# Patient Record
Sex: Male | Born: 1995 | Race: White | Hispanic: No | Marital: Single | Smoking: Never smoker
Health system: Southern US, Community
[De-identification: ages and names within clinical notes are randomized; demographics above are authoritative.]

## PROBLEM LIST (undated history)

## (undated) DIAGNOSIS — F913 Oppositional defiant disorder: Secondary | ICD-10-CM

## (undated) DIAGNOSIS — F909 Attention-deficit hyperactivity disorder, unspecified type: Secondary | ICD-10-CM

## (undated) HISTORY — DX: Attention-deficit hyperactivity disorder, unspecified type: F90.9

## (undated) HISTORY — DX: Oppositional defiant disorder: F91.3

---
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
## 2017-02-21 ENCOUNTER — Telehealth (HOSPITAL_COMMUNITY): Payer: Self-pay | Admitting: *Deleted

## 2017-02-21 NOTE — Telephone Encounter (Signed)
Pt called stating his insurance will not pay for the 90 days supply for his Vyvanse. Per pt his insurance will only pay for the only 30 days at a time supply. Per pt, he would like to know if Dr. Tenny Crawoss could reprint 3 scripts for Vyvanse with each of them for 30 days supply. Pt number is [phone]. Per pt he will pick up the scripts the week of the Aug 12th when he gets back from vacation.

## 2017-02-22 ENCOUNTER — Other Ambulatory Visit (HOSPITAL_COMMUNITY): Payer: Self-pay | Admitting: Psychiatry

## 2017-02-22 MED ORDER — LISDEXAMFETAMINE DIMESYLATE 70 MG PO CAPS: 70.0000 mg | ORAL_CAPSULE | ORAL | 0 refills | Status: DC

## 2017-02-22 MED ORDER — LISDEXAMFETAMINE DIMESYLATE 70 MG PO CAPS: 70.0000 mg | ORAL_CAPSULE | Freq: Every day | ORAL | 0 refills | Status: DC

## 2017-02-22 NOTE — Telephone Encounter (Signed)
printed

## 2017-02-25 NOTE — Telephone Encounter (Signed)
lmtcb

## 2017-03-07 ENCOUNTER — Telehealth (HOSPITAL_COMMUNITY): Payer: Self-pay | Admitting: *Deleted

## 2017-03-07 ENCOUNTER — Other Ambulatory Visit (HOSPITAL_COMMUNITY): Payer: Self-pay | Admitting: Psychiatry

## 2017-03-07 DIAGNOSIS — F902 Attention-deficit hyperactivity disorder, combined type: Secondary | ICD-10-CM

## 2017-03-07 MED ORDER — LISDEXAMFETAMINE DIMESYLATE 70 MG PO CAPS: 70.0000 mg | ORAL_CAPSULE | ORAL | 0 refills | Status: DC

## 2017-03-07 MED ORDER — LISDEXAMFETAMINE DIMESYLATE 70 MG PO CAPS: 70.0000 mg | ORAL_CAPSULE | Freq: Every day | ORAL | 0 refills | Status: DC

## 2017-03-07 MED ORDER — GUANFACINE HCL ER 2 MG PO TB24: 2.0000 mg | ORAL_TABLET | Freq: Every day | ORAL | 1 refills | Status: DC

## 2017-03-07 NOTE — Telephone Encounter (Signed)
Pt came into office to pick up his printed script for his Vyvanse. Script Order ID number is [id number] and [id number] and ID number is [id number]. Pt D/L number is [id number] exp 03-18-2019. Pt showed understanding when both scripts were given to him.

## 2017-03-07 NOTE — Telephone Encounter (Signed)
Pt came into office and picked up printed scripts

## 2017-04-30 ENCOUNTER — Ambulatory Visit (HOSPITAL_COMMUNITY): Payer: Self-pay | Admitting: Psychiatry

## 2017-05-27 ENCOUNTER — Telehealth (HOSPITAL_COMMUNITY): Payer: Self-pay | Admitting: *Deleted

## 2017-05-27 NOTE — Telephone Encounter (Signed)
voice message from patient, said he need refill.   He is in college and can't come for appointment until school break.

## 2017-05-28 ENCOUNTER — Other Ambulatory Visit (HOSPITAL_COMMUNITY): Payer: Self-pay | Admitting: Psychiatry

## 2017-05-28 ENCOUNTER — Telehealth (HOSPITAL_COMMUNITY): Payer: Self-pay | Admitting: *Deleted

## 2017-05-28 MED ORDER — LISDEXAMFETAMINE DIMESYLATE 70 MG PO CAPS: 70.0000 mg | ORAL_CAPSULE | Freq: Every day | ORAL | 0 refills | Status: DC

## 2017-05-28 NOTE — Telephone Encounter (Signed)
Spoke with patient notified that RX available for pick up

## 2017-05-28 NOTE — Telephone Encounter (Signed)
Printed, he or family member will need to pick it up

## 2017-05-29 NOTE — Telephone Encounter (Signed)
noted 

## 2017-05-30 ENCOUNTER — Telehealth (HOSPITAL_COMMUNITY): Payer: Self-pay | Admitting: *Deleted

## 2017-05-30 NOTE — Telephone Encounter (Signed)
voice message from patient said he cant come until school break, he is in college.  He wants to speak with Lajoyce Cornersctavia about his prescription.

## 2017-05-31 ENCOUNTER — Telehealth (HOSPITAL_COMMUNITY): Payer: Self-pay | Admitting: *Deleted

## 2017-05-31 NOTE — Telephone Encounter (Signed)
I printed it on 11/6, he or family member can pick it up

## 2017-05-31 NOTE — Telephone Encounter (Signed)
Spoke with pt and per verbal from pt to allow his mother to pick up his printed script due to him currently being at school. Printed script is for Vyvanse 70 mg. Script ID number is [id number] and script order ID number is [id number]. Pt mother name is Darden AmberCaroline Blackstock and her D/L number is [id number] and expiration date is 09-24-2022.

## 2017-07-09 ENCOUNTER — Encounter (INDEPENDENT_AMBULATORY_CARE_PROVIDER_SITE_OTHER): Payer: Self-pay

## 2017-07-09 ENCOUNTER — Other Ambulatory Visit (HOSPITAL_COMMUNITY): Payer: Self-pay | Admitting: Psychiatry

## 2017-07-09 ENCOUNTER — Telehealth (HOSPITAL_COMMUNITY): Payer: Self-pay | Admitting: *Deleted

## 2017-07-09 ENCOUNTER — Ambulatory Visit (HOSPITAL_COMMUNITY): Payer: 59 | Admitting: Psychiatry

## 2017-07-09 ENCOUNTER — Encounter (HOSPITAL_COMMUNITY): Payer: Self-pay | Admitting: Psychiatry

## 2017-07-09 DIAGNOSIS — F902 Attention-deficit hyperactivity disorder, combined type: Secondary | ICD-10-CM

## 2017-07-09 DIAGNOSIS — Z818 Family history of other mental and behavioral disorders: Secondary | ICD-10-CM | POA: Diagnosis not present

## 2017-07-09 MED ORDER — GUANFACINE HCL ER 2 MG PO TB24: 2.0000 mg | ORAL_TABLET | Freq: Every day | ORAL | 0 refills | Status: DC

## 2017-07-09 MED ORDER — GUANFACINE HCL ER 2 MG PO TB24: 2.0000 mg | ORAL_TABLET | Freq: Every day | ORAL | 2 refills | Status: DC

## 2017-07-09 MED ORDER — LISDEXAMFETAMINE DIMESYLATE 70 MG PO CAPS
70.0000 mg | ORAL_CAPSULE | Freq: Every day | ORAL | 0 refills | Status: DC
Start: 2017-07-09 — End: 2017-12-04

## 2017-07-09 MED ORDER — LISDEXAMFETAMINE DIMESYLATE 70 MG PO CAPS: 70.0000 mg | ORAL_CAPSULE | Freq: Every day | ORAL | 0 refills | Status: DC

## 2017-07-09 NOTE — Telephone Encounter (Signed)
Patient notified

## 2017-07-09 NOTE — Progress Notes (Signed)
BH MD/PA/NP OP Progress Note  07/09/2017 2:46 PM Harry Mcdonald  MRN:  [id number]  Chief Complaint:  Chief Complaint    ADHD; Follow-up     HPI: He is a 21 year old white male who lives with his mother and stepfather primarily. He is a 10021 year old half brother in the home. His father also lives nearby and he spent some time with him as well. The [place] in [place] .He is a sophomore at [employer]  The patient was diagnosed with ADHD in the third grade and has been on medication ever since.  This year he is really getting some hard courses but so far he is doing well.   Staying well focused on the Vyvanse and guanfacine.  His mood has been good and he is sleeping and eating well.  He is participating in some intramural sports.  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  He denies any symptoms of depression Visit Diagnosis:    ICD-10-CM   1. ADHD (attention deficit hyperactivity disorder), combined type F90.2 lisdexamfetamine (VYVANSE) 70 MG capsule    guanFACINE (INTUNIV) 2 MG TB24 ER tablet    DISCONTINUED: lisdexamfetamine (VYVANSE) 70 MG capsule    DISCONTINUED: guanFACINE (INTUNIV) 2 MG TB24 ER tablet    Past Psychiatric History: None  Past Medical History:  Past Medical History:  Diagnosis Date  . ADHD (attention deficit hyperactivity disorder)   . Oppositional defiant disorder    History reviewed. No pertinent surgical history.  Family Psychiatric History: See below  Family History:  Family History  Problem Relation Age of Onset  . Depression Mother   . ADD / ADHD Mother   . ADD / ADHD Father   . Anxiety disorder Father   . Depression Father   . OCD Paternal Grandfather   . Depression Paternal Grandmother   . Seizures Paternal Grandmother        secondary to head trauma  . Physical abuse Paternal Grandmother   . Alcohol abuse Neg Hx   . Drug abuse Neg Hx   . Bipolar disorder Neg Hx   . Dementia Neg Hx   . Paranoid behavior Neg Hx   .  Schizophrenia Neg Hx   . Sexual abuse Neg Hx     Social History:  Social History   Socioeconomic History  . Marital status: Single    Spouse name: None  . Number of children: None  . Years of education: None  . Highest education level: None  Social Needs  . Financial resource strain: None  . Food insecurity - worry: None  . Food insecurity - inability: None  . Transportation needs - medical: None  . Transportation needs - non-medical: None  Occupational History  . None  Tobacco Use  . Smoking status: Never Smoker  . Smokeless tobacco: Never Used  Substance and Sexual Activity  . Alcohol use: No  . Drug use: No  . Sexual activity: None  Other Topics Concern  . None  Social History Narrative  . None    Allergies: No Known Allergies  Metabolic Disorder Labs: No results found for: HGBA1C, MPG No results found for: PROLACTIN No results found for: CHOL, TRIG, HDL, CHOLHDL, VLDL, LDLCALC No results found for: TSH  Therapeutic Level Labs: No results found for: LITHIUM No results found for: VALPROATE No components found for:  CBMZ  Current Medications: Current Outpatient Medications  Medication Sig Dispense Refill  . dexmethylphenidate (FOCALIN) 10 MG tablet Take one after school 30 tablet  0  . dexmethylphenidate (FOCALIN) 10 MG tablet Take one after school 30 tablet 0  . dexmethylphenidate (FOCALIN) 10 MG tablet Take one after school 60 tablet 0  . fexofenadine (ALLEGRA) 180 MG tablet Take 180 mg by mouth daily.    Marland Kitchen. guanFACINE (INTUNIV) 2 MG TB24 ER tablet Take 1 tablet (2 mg total) by mouth daily. 90 tablet 0  . lisdexamfetamine (VYVANSE) 70 MG capsule Take 1 capsule (70 mg total) by mouth every morning. 30 capsule 0  . lisdexamfetamine (VYVANSE) 70 MG capsule Take 1 capsule (70 mg total) by mouth daily. 30 capsule 0  . lisdexamfetamine (VYVANSE) 70 MG capsule Take 1 capsule (70 mg total) by mouth daily. 30 capsule 0   No current facility-administered medications  for this visit.      Musculoskeletal: Strength & Muscle Tone: within normal limits Gait & Station: normal Patient leans: N/A  Psychiatric Specialty Exam: Review of Systems  All other systems reviewed and are negative.   Blood pressure 112/62, pulse 87, height 5\' 7"  (1.702 m), weight 162 lb (73.5 kg), SpO2 96 %.Body mass index is 25.37 kg/m.  General Appearance: Casual and Fairly Groomed  Eye Contact:  Good  Speech:  Clear and Coherent  Volume:  Normal  Mood:  Euthymic  Affect:  Congruent  Thought Process:  Goal Directed  Orientation:  Full (Time, Place, and Person)  Thought Content: WDL   Suicidal Thoughts:  No  Homicidal Thoughts:  No  Memory:  Immediate;   Good Recent;   Good Remote;   Good  Judgement:  Good  Insight:  Good  Psychomotor Activity:  Normal  Concentration:  Concentration: Good and Attention Span: Good  Recall:  Good  Fund of Knowledge: Good  Language: Good  Akathisia:  No  Handed:  Right  AIMS (if indicated): not done  Assets:  Communication Skills Desire for Improvement Physical Health Resilience Social Support Talents/Skills  ADL's:  Intact  Cognition: WNL  Sleep:  Good   Screenings:   Assessment and Plan: 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  He is doing very well on the combination of Vyvanse 70 mg every morning and Intuniv 2 mg daily for ADHD.  He will continue these medications and return to see me in 6 months   Diannia Rudereborah Ross, MD 07/09/2017, 2:46 PM

## 2017-07-09 NOTE — Telephone Encounter (Signed)
Dr. Doyce Paraoss  [facility] called stating that his insurance will not cover 90 day supply. It must be 3 day supply for Intuniv &   That vyanse can't be refilled before 08/01/17 & that he has 10 left

## 2017-07-09 NOTE — Telephone Encounter (Signed)
Intuniv resent for 30 days, 2 refills to [facility]. The initial prescription for vyvanse was sent there as well and he can pick it up now

## 2017-10-24 ENCOUNTER — Other Ambulatory Visit (HOSPITAL_COMMUNITY): Payer: Self-pay | Admitting: Psychiatry

## 2017-10-24 ENCOUNTER — Telehealth (HOSPITAL_COMMUNITY): Payer: Self-pay | Admitting: *Deleted

## 2017-10-24 DIAGNOSIS — F902 Attention-deficit hyperactivity disorder, combined type: Secondary | ICD-10-CM

## 2017-10-24 MED ORDER — LISDEXAMFETAMINE DIMESYLATE 70 MG PO CAPS: 70.0000 mg | ORAL_CAPSULE | Freq: Every day | ORAL | 0 refills | Status: DC

## 2017-10-24 MED ORDER — GUANFACINE HCL ER 2 MG PO TB24: 2.0000 mg | ORAL_TABLET | Freq: Every day | ORAL | 2 refills | Status: DC

## 2017-10-24 MED ORDER — LISDEXAMFETAMINE DIMESYLATE 70 MG PO CAPS: 70.0000 mg | ORAL_CAPSULE | ORAL | 0 refills | Status: DC

## 2017-10-24 NOTE — Telephone Encounter (Signed)
sent 

## 2017-10-24 NOTE — Telephone Encounter (Signed)
Dr Tenny Crawoss Patient called stating he's in school & that he needs a refill sent to school Pharmacy  [facility] Stated that he needs the Vyvanse

## 2017-12-03 ENCOUNTER — Telehealth (HOSPITAL_COMMUNITY): Payer: Self-pay | Admitting: *Deleted

## 2017-12-03 NOTE — Telephone Encounter (Signed)
Dr Tenny Craw Patient called requesting refills . He is also scheduling a 6 month follow up

## 2017-12-04 ENCOUNTER — Other Ambulatory Visit (HOSPITAL_COMMUNITY): Payer: Self-pay | Admitting: Psychiatry

## 2017-12-04 DIAGNOSIS — F902 Attention-deficit hyperactivity disorder, combined type: Secondary | ICD-10-CM

## 2017-12-04 MED ORDER — LISDEXAMFETAMINE DIMESYLATE 70 MG PO CAPS: 70.0000 mg | ORAL_CAPSULE | Freq: Every day | ORAL | 0 refills | Status: DC

## 2017-12-04 MED ORDER — DEXMETHYLPHENIDATE HCL 10 MG PO TABS: ORAL_TABLET | ORAL | 0 refills | Status: DC

## 2017-12-04 MED ORDER — GUANFACINE HCL ER 2 MG PO TB24: 2.0000 mg | ORAL_TABLET | Freq: Every day | ORAL | 2 refills | Status: DC

## 2017-12-04 MED ORDER — LISDEXAMFETAMINE DIMESYLATE 70 MG PO CAPS: 70.0000 mg | ORAL_CAPSULE | ORAL | 0 refills | Status: DC

## 2017-12-04 NOTE — Telephone Encounter (Signed)
90 days sent in, but he needs appt

## 2017-12-09 ENCOUNTER — Encounter (HOSPITAL_COMMUNITY): Payer: Self-pay | Admitting: Psychiatry

## 2017-12-09 ENCOUNTER — Ambulatory Visit (INDEPENDENT_AMBULATORY_CARE_PROVIDER_SITE_OTHER): Payer: 59 | Admitting: Psychiatry

## 2017-12-09 VITALS — BP 132/82 | HR 80 | Ht 67.0 in | Wt 175.0 lb

## 2017-12-09 DIAGNOSIS — Z818 Family history of other mental and behavioral disorders: Secondary | ICD-10-CM | POA: Diagnosis not present

## 2017-12-09 DIAGNOSIS — F902 Attention-deficit hyperactivity disorder, combined type: Secondary | ICD-10-CM | POA: Diagnosis not present

## 2017-12-09 NOTE — Progress Notes (Signed)
BH MD/PA/NP OP Progress Note  12/09/2017 11:13 AM ACEL NATZKE  MRN:  [id number]  Chief Complaint:  Chief Complaint    ADHD; Follow-up     HPI: This patient is a 22 year old male who lives with his mother and stepfather primarily.  He has an 62 year old half brother in the home.  His father also lives nearby and he spends time with him as well.  The family lives in [place].  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  He has been on medication for this since the third grade.  He is focusing well in school but the courses are getting a lot harder.  On top of this he is not sure if he likes the engineering courses.  And is hard he thinks he really would rather be an Technical sales engineer but he is going to try to decide this fairly soon.  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  He denies any symptoms of depression and anxiety and still feels his medications of help with focus Visit Diagnosis:    ICD-10-CM   1. ADHD (attention deficit hyperactivity disorder), combined type F90.2     Past Psychiatric History: none  Past Medical History:  Past Medical History:  Diagnosis Date  . ADHD (attention deficit hyperactivity disorder)   . Oppositional defiant disorder    History reviewed. No pertinent surgical history.  Family Psychiatric History: See below  Family History:  Family History  Problem Relation Age of Onset  . Depression Mother   . ADD / ADHD Mother   . ADD / ADHD Father   . Anxiety disorder Father   . Depression Father   . OCD Paternal Grandfather   . Depression Paternal Grandmother   . Seizures Paternal Grandmother        secondary to head trauma  . Physical abuse Paternal Grandmother   . Alcohol abuse Neg Hx   . Drug abuse Neg Hx   . Bipolar disorder Neg Hx   . Dementia Neg Hx   . Paranoid behavior Neg Hx   .  Schizophrenia Neg Hx   . Sexual abuse Neg Hx     Social History:  Social History   Socioeconomic History  . Marital status: Single    Spouse name: Not on file  . Number of children: Not on file  . Years of education: Not on file  . Highest education level: Not on file  Occupational History  . Not on file  Social Needs  . Financial resource strain: Not on file  . Food insecurity:    Worry: Not on file    Inability: Not on file  . Transportation needs:    Medical: Not on file    Non-medical: Not on file  Tobacco Use  . Smoking status: Never Smoker  . Smokeless tobacco: Never Used  Substance and Sexual Activity  . Alcohol use: No  . Drug use: No  . Sexual activity: Not on file  Lifestyle  . Physical activity:    Days per week: Not on file    Minutes per session: Not on file  . Stress: Not on file  Relationships  . Social connections:    Talks on phone: Not on file    Gets together: Not on file    Attends religious service: Not on file    Active  member of club or organization: Not on file    Attends meetings of clubs or organizations: Not on file    Relationship status: Not on file  Other Topics Concern  . Not on file  Social History Narrative  . Not on file    Allergies: No Known Allergies  Metabolic Disorder Labs: No results found for: HGBA1C, MPG No results found for: PROLACTIN No results found for: CHOL, TRIG, HDL, CHOLHDL, VLDL, LDLCALC No results found for: TSH  Therapeutic Level Labs: No results found for: LITHIUM No results found for: VALPROATE No components found for:  CBMZ  Current Medications: Current Outpatient Medications  Medication Sig Dispense Refill  . dexmethylphenidate (FOCALIN) 10 MG tablet Take one after school 30 tablet 0  . dexmethylphenidate (FOCALIN) 10 MG tablet Take one after school 30 tablet 0  . dexmethylphenidate (FOCALIN) 10 MG tablet Take one after school 60 tablet 0  . fexofenadine (ALLEGRA) 180 MG tablet Take 180 mg by  mouth daily.    Marland Kitchen guanFACINE (INTUNIV) 2 MG TB24 ER tablet Take 1 tablet (2 mg total) by mouth daily. 30 tablet 2  . lisdexamfetamine (VYVANSE) 70 MG capsule Take 1 capsule (70 mg total) by mouth daily. 30 capsule 0  . lisdexamfetamine (VYVANSE) 70 MG capsule Take 1 capsule (70 mg total) by mouth every morning. 30 capsule 0  . lisdexamfetamine (VYVANSE) 70 MG capsule Take 1 capsule (70 mg total) by mouth daily. 30 capsule 0   No current facility-administered medications for this visit.      Musculoskeletal: Strength & Muscle Tone: within normal limits Gait & Station: normal Patient leans: N/A  Psychiatric Specialty Exam: Review of Systems  All other systems reviewed and are negative.   Blood pressure 132/82, pulse 80, height  (1.702 m), weight 175 lb (79.4 kg), SpO2 97 %.Body mass index is 27.41 kg/m.  General Appearance: Casual and Fairly Groomed  Eye Contact:  Good  Speech:  Clear and Coherent  Volume:  Normal  Mood:  Euthymic  Affect:  Congruent  Thought Process:  Goal Directed  Orientation:  Full (Time, Place, and Person)  Thought Content: Rumination   Suicidal Thoughts:  No  Homicidal Thoughts:  No  Memory:  Immediate;   Good Recent;   Good Remote;   Good  Judgement:  Good  Insight:  Good  Psychomotor Activity:  Normal  Concentration:  Concentration: Good and Attention Span: Good  Recall:  Good  Fund of Knowledge: Good  Language: Good  Akathisia:  No  Handed:  Right  AIMS (if indicated): not done  Assets:  Communication Skills Desire for Improvement Physical Health Resilience Social Support Vocational/Educational  ADL's:  Intact  Cognition: WNL  Sleep:  Good   Screenings:   Assessment and Plan: This patient is a 22 year old male with a history of ADHD.  He continues to do well on his current medications.  He will continue Vyvanse 70 mg every morning, Intuniv 2 mg daily and Focalin 10 mg in the afternoon for ADHD.  He will return to see me in 6  months   Diannia Ruder, MD 12/09/2017, 11:13 AM

## 2018-02-10 ENCOUNTER — Telehealth (HOSPITAL_COMMUNITY): Payer: Self-pay | Admitting: *Deleted

## 2018-02-10 NOTE — Telephone Encounter (Signed)
Patient called to see if he had any refills on his med's & to ask about how paperwork for school is handled

## 2018-02-19 ENCOUNTER — Encounter (HOSPITAL_COMMUNITY): Payer: Self-pay | Admitting: Psychiatry

## 2018-02-19 ENCOUNTER — Ambulatory Visit (INDEPENDENT_AMBULATORY_CARE_PROVIDER_SITE_OTHER): Payer: 59 | Admitting: Psychiatry

## 2018-02-19 DIAGNOSIS — F902 Attention-deficit hyperactivity disorder, combined type: Secondary | ICD-10-CM | POA: Diagnosis not present

## 2018-02-19 MED ORDER — DEXMETHYLPHENIDATE HCL 10 MG PO TABS: ORAL_TABLET | ORAL | 0 refills | Status: DC

## 2018-02-19 MED ORDER — LISDEXAMFETAMINE DIMESYLATE 70 MG PO CAPS: 70.0000 mg | ORAL_CAPSULE | ORAL | 0 refills | Status: DC

## 2018-02-19 MED ORDER — GUANFACINE HCL ER 2 MG PO TB24: 2.0000 mg | ORAL_TABLET | Freq: Every day | ORAL | 2 refills | Status: DC

## 2018-02-19 MED ORDER — LISDEXAMFETAMINE DIMESYLATE 70 MG PO CAPS: 70.0000 mg | ORAL_CAPSULE | Freq: Every day | ORAL | 0 refills | Status: DC

## 2018-02-19 NOTE — Progress Notes (Signed)
BH MD/PA/NP OP Progress Note  02/19/2018 3:18 PM Harry Mcdonald  MRN:  [id number]  Chief Complaint:  Chief Complaint    ADHD; Follow-up     HPI: This patient is a 22 year old male who lives between the homes of his divorced parents.  The summer he is staying with his father in [place].  His mother lives in [place].  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  The patient returns for follow-up regarding ADHD.  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  He states that his medications are working well for focus.  However he had me fill out some forms for disability services at [facility] so that he can get extended time on testing in a separate setting.  He states that he was having trouble with task completion in the allotted time.  He denies any other issues at school.  Any student there can get free tutoring and is going to pursue this. Visit Diagnosis:    ICD-10-CM   1. ADHD (attention deficit hyperactivity disorder), combined type F90.2 lisdexamfetamine (VYVANSE) 70 MG capsule    guanFACINE (INTUNIV) 2 MG TB24 ER tablet    Past Psychiatric History: none  Past Medical History:  Past Medical History:  Diagnosis Date  . ADHD (attention deficit hyperactivity disorder)   . Oppositional defiant disorder    History reviewed. No pertinent surgical history.  Family Psychiatric History: See below  Family History:  Family History  Problem Relation Age of Onset  . Depression Mother   . ADD / ADHD Mother   . ADD / ADHD Father   . Anxiety disorder Father   . Depression Father   . OCD Paternal Grandfather   . Depression Paternal Grandmother   . Seizures Paternal Grandmother        secondary to head trauma  . Physical abuse Paternal Grandmother   . Alcohol abuse Neg Hx   . Drug abuse Neg Hx   . Bipolar disorder Neg Hx   . Dementia Neg Hx   . Paranoid behavior Neg Hx   .  Schizophrenia Neg Hx   . Sexual abuse Neg Hx     Social History:  Social History   Socioeconomic History  . Marital status: Single    Spouse name: Not on file  . Number of children: Not on file  . Years of education: Not on file  . Highest education level: Not on file  Occupational History  . Not on file  Social Needs  . Financial resource strain: Not on file  . Food insecurity:    Worry: Not on file    Inability: Not on file  . Transportation needs:    Medical: Not on file    Non-medical: Not on file  Tobacco Use  . Smoking status: Never Smoker  . Smokeless tobacco: Never Used  Substance and Sexual Activity  . Alcohol use: No  . Drug use: No  . Sexual activity: Not on file  Lifestyle  . Physical activity:    Days per week: Not on file    Minutes per session: Not on file  . Stress: Not on file  Relationships  . Social connections:    Talks on phone: Not on file    Gets together: Not on file    Attends religious service: Not on file    Active member of club or organization: Not on file  Attends meetings of clubs or organizations: Not on file    Relationship status: Not on file  Other Topics Concern  . Not on file  Social History Narrative  . Not on file    Allergies: No Known Allergies  Metabolic Disorder Labs: No results found for: HGBA1C, MPG No results found for: PROLACTIN No results found for: CHOL, TRIG, HDL, CHOLHDL, VLDL, LDLCALC No results found for: TSH  Therapeutic Level Labs: No results found for: LITHIUM No results found for: VALPROATE No components found for:  CBMZ  Current Medications: Current Outpatient Medications  Medication Sig Dispense Refill  . dexmethylphenidate (FOCALIN) 10 MG tablet Take one after school 30 tablet 0  . dexmethylphenidate (FOCALIN) 10 MG tablet Take one after school 30 tablet 0  . dexmethylphenidate (FOCALIN) 10 MG tablet Take one after school 60 tablet 0  . fexofenadine (ALLEGRA) 180 MG tablet Take 180 mg by  mouth daily.    Marland Kitchen. guanFACINE (INTUNIV) 2 MG TB24 ER tablet Take 1 tablet (2 mg total) by mouth daily. 30 tablet 2  . lisdexamfetamine (VYVANSE) 70 MG capsule Take 1 capsule (70 mg total) by mouth daily. 30 capsule 0  . lisdexamfetamine (VYVANSE) 70 MG capsule Take 1 capsule (70 mg total) by mouth every morning. 30 capsule 0  . lisdexamfetamine (VYVANSE) 70 MG capsule Take 1 capsule (70 mg total) by mouth daily. 30 capsule 0   No current facility-administered medications for this visit.      Musculoskeletal: Strength & Muscle Tone: within normal limits Gait & Station: normal Patient leans: N/A  Psychiatric Specialty Exam: Review of Systems  All other systems reviewed and are negative.   Blood pressure 103/64, pulse 63, height 5\' 7"  (1.702 m), weight 176 lb (79.8 kg), SpO2 97 %.Body mass index is 27.57 kg/m.  General Appearance: Casual and Fairly Groomed  Eye Contact:  Good  Speech:  Clear and Coherent  Volume:  Normal  Mood:  Euthymic  Affect:  Congruent  Thought Process:  Goal Directed  Orientation:  Full (Time, Place, and Person)  Thought Content: WDL   Suicidal Thoughts:  No  Homicidal Thoughts:  No  Memory:  Immediate;   Good Recent;   Good Remote;   Good  Judgement:  Good  Insight:  Good  Psychomotor Activity:  Normal  Concentration:  Concentration: Good and Attention Span: Good  Recall:  Good  Fund of Knowledge: Good  Language: Good  Akathisia:  No  Handed:  Right  AIMS (if indicated): not done  Assets:  Communication Skills Desire for Improvement Physical Health Resilience Social Support Talents/Skills Vocational/Educational  ADL's:  Intact  Cognition: WNL  Sleep:  Good   Screenings:   Assessment and Plan: Patient is a 22 year old male with a history of ADHD.  He is doing well on his current regimen.  He will continue Vyvanse 70 mg every morning and Focalin 10 mg in the afternoon for ADHD symptoms as well as Intuniv 2 mg every morning for ADHD as well.   He will return to see me in 4 months   Harry Rudereborah Tabari Volkert, MD 02/19/2018, 3:18 PM

## 2018-03-10 ENCOUNTER — Telehealth (HOSPITAL_COMMUNITY): Payer: Self-pay

## 2018-03-10 NOTE — Telephone Encounter (Signed)
Patient wants V[facility]se [facility].

## 2018-03-11 ENCOUNTER — Other Ambulatory Visit (HOSPITAL_COMMUNITY): Payer: Self-pay | Admitting: Psychiatry

## 2018-03-11 DIAGNOSIS — F902 Attention-deficit hyperactivity disorder, combined type: Secondary | ICD-10-CM

## 2018-03-11 MED ORDER — LISDEXAMFETAMINE DIMESYLATE 70 MG PO CAPS: 70.0000 mg | ORAL_CAPSULE | ORAL | 0 refills | Status: DC

## 2018-03-11 MED ORDER — LISDEXAMFETAMINE DIMESYLATE 70 MG PO CAPS: 70.0000 mg | ORAL_CAPSULE | Freq: Every day | ORAL | 0 refills | Status: DC

## 2018-03-11 NOTE — Telephone Encounter (Signed)
3 months sent

## 2018-03-11 NOTE — Telephone Encounter (Signed)
Called patient he picked up medication today

## 2018-04-14 ENCOUNTER — Other Ambulatory Visit (HOSPITAL_COMMUNITY): Payer: Self-pay | Admitting: Psychiatry

## 2018-04-14 ENCOUNTER — Telehealth (HOSPITAL_COMMUNITY): Payer: Self-pay | Admitting: *Deleted

## 2018-04-14 DIAGNOSIS — F902 Attention-deficit hyperactivity disorder, combined type: Secondary | ICD-10-CM

## 2018-04-14 MED ORDER — LISDEXAMFETAMINE DIMESYLATE 70 MG PO CAPS: 70.0000 mg | ORAL_CAPSULE | Freq: Every day | ORAL | 0 refills | Status: DC

## 2018-04-14 MED ORDER — DEXMETHYLPHENIDATE HCL 10 MG PO TABS: ORAL_TABLET | ORAL | 0 refills | Status: DC

## 2018-04-14 MED ORDER — LISDEXAMFETAMINE DIMESYLATE 70 MG PO CAPS: 70.0000 mg | ORAL_CAPSULE | ORAL | 0 refills | Status: DC

## 2018-04-14 NOTE — Telephone Encounter (Signed)
Dr Tenny Crawoss Patient called he's back in school last visit was 02/19/18 & he's requesting refills on Focalin &  Vyvanse to be sent to Rx [place]

## 2018-04-14 NOTE — Telephone Encounter (Signed)
done

## 2018-04-14 NOTE — Telephone Encounter (Signed)
Opened chart to check last visit

## 2018-05-15 ENCOUNTER — Other Ambulatory Visit (HOSPITAL_COMMUNITY): Payer: Self-pay | Admitting: Psychiatry

## 2018-05-15 ENCOUNTER — Telehealth (HOSPITAL_COMMUNITY): Payer: Self-pay | Admitting: *Deleted

## 2018-05-15 DIAGNOSIS — F902 Attention-deficit hyperactivity disorder, combined type: Secondary | ICD-10-CM

## 2018-05-15 MED ORDER — GUANFACINE HCL ER 2 MG PO TB24: 2.0000 mg | ORAL_TABLET | Freq: Every day | ORAL | 2 refills | Status: DC

## 2018-05-15 NOTE — Telephone Encounter (Signed)
Dr Tenny Craw Patient called to get refill on the  guanFACINE (INTUNIV) 2 MG TB24 ER tablet 30 tablet

## 2018-05-15 NOTE — Telephone Encounter (Signed)
sent 

## 2018-07-21 ENCOUNTER — Telehealth (HOSPITAL_COMMUNITY): Payer: Self-pay | Admitting: *Deleted

## 2018-07-21 ENCOUNTER — Other Ambulatory Visit (HOSPITAL_COMMUNITY): Payer: Self-pay | Admitting: Psychiatry

## 2018-07-21 DIAGNOSIS — F902 Attention-deficit hyperactivity disorder, combined type: Secondary | ICD-10-CM

## 2018-07-21 NOTE — Telephone Encounter (Signed)
Which pharmacy?

## 2018-07-21 NOTE — Telephone Encounter (Signed)
Dr Tenny Crawoss Patient home for winter break called requesting refills

## 2018-07-21 NOTE — Telephone Encounter (Signed)
Dr Dani Gobbleoss [facility] [place], Updated in system

## 2018-07-22 ENCOUNTER — Other Ambulatory Visit (HOSPITAL_COMMUNITY): Payer: Self-pay | Admitting: Psychiatry

## 2018-07-22 DIAGNOSIS — F902 Attention-deficit hyperactivity disorder, combined type: Secondary | ICD-10-CM

## 2018-07-22 MED ORDER — DEXMETHYLPHENIDATE HCL 10 MG PO TABS: ORAL_TABLET | ORAL | 0 refills | Status: DC

## 2018-07-22 MED ORDER — GUANFACINE HCL ER 2 MG PO TB24: 2.0000 mg | ORAL_TABLET | Freq: Every day | ORAL | 2 refills | Status: DC

## 2018-07-22 MED ORDER — LISDEXAMFETAMINE DIMESYLATE 70 MG PO CAPS: 70.0000 mg | ORAL_CAPSULE | Freq: Every day | ORAL | 0 refills | Status: DC

## 2018-07-22 NOTE — Telephone Encounter (Signed)
sent 

## 2018-09-01 ENCOUNTER — Other Ambulatory Visit (HOSPITAL_COMMUNITY): Payer: Self-pay | Admitting: Psychiatry

## 2018-10-08 ENCOUNTER — Other Ambulatory Visit (HOSPITAL_COMMUNITY): Payer: Self-pay | Admitting: Psychiatry

## 2018-10-08 ENCOUNTER — Telehealth (HOSPITAL_COMMUNITY): Payer: Self-pay | Admitting: *Deleted

## 2018-10-08 DIAGNOSIS — F902 Attention-deficit hyperactivity disorder, combined type: Secondary | ICD-10-CM

## 2018-10-08 MED ORDER — DEXMETHYLPHENIDATE HCL 10 MG PO TABS: ORAL_TABLET | ORAL | 0 refills | Status: DC

## 2018-10-08 MED ORDER — GUANFACINE HCL ER 2 MG PO TB24: 2.0000 mg | ORAL_TABLET | Freq: Every day | ORAL | 2 refills | Status: DC

## 2018-10-08 MED ORDER — LISDEXAMFETAMINE DIMESYLATE 70 MG PO CAPS: 70.0000 mg | ORAL_CAPSULE | ORAL | 0 refills | Status: DC

## 2018-10-08 NOTE — Telephone Encounter (Signed)
90 days sent on guanfacine, vyvanse and focalin are controlled so only can send 30 days

## 2018-10-08 NOTE — Telephone Encounter (Signed)
Dr Tenny Craw Patient called Mom has new insurance & preferred Rx has been updated.  Patient requested 90-day supply refill on psych med's

## 2018-10-09 MED FILL — DEXMETHYLPHENIDATE 10 MG TA: 10 | 30 days supply | Qty: 30 | Fill #0

## 2018-10-09 MED FILL — guanFACINE HCL ER 2 MG TB24: 2 | 90 days supply | Qty: 90 | Fill #0

## 2018-10-09 MED FILL — VYVANSE 70 MG CAPSULE: 70 | 30 days supply | Qty: 30 | Fill #0

## 2018-12-09 ENCOUNTER — Ambulatory Visit (HOSPITAL_COMMUNITY): Payer: Self-pay | Admitting: Psychiatry

## 2018-12-09 ENCOUNTER — Other Ambulatory Visit: Payer: Self-pay

## 2018-12-19 ENCOUNTER — Other Ambulatory Visit: Payer: Self-pay

## 2018-12-19 ENCOUNTER — Encounter (HOSPITAL_COMMUNITY): Payer: Self-pay | Admitting: Psychiatry

## 2018-12-19 ENCOUNTER — Ambulatory Visit (INDEPENDENT_AMBULATORY_CARE_PROVIDER_SITE_OTHER): Payer: No Typology Code available for payment source | Admitting: Psychiatry

## 2018-12-19 DIAGNOSIS — F902 Attention-deficit hyperactivity disorder, combined type: Secondary | ICD-10-CM

## 2018-12-19 MED ORDER — DEXMETHYLPHENIDATE HCL 10 MG PO TABS: ORAL_TABLET | ORAL | 0 refills | Status: DC

## 2018-12-19 MED ORDER — GUANFACINE HCL ER 2 MG PO TB24: 2.0000 mg | ORAL_TABLET | Freq: Every day | ORAL | 2 refills | Status: DC

## 2018-12-19 MED ORDER — LISDEXAMFETAMINE DIMESYLATE 70 MG PO CAPS: 70.0000 mg | ORAL_CAPSULE | ORAL | 0 refills | Status: DC

## 2018-12-19 MED ORDER — LISDEXAMFETAMINE DIMESYLATE 70 MG PO CAPS: 70.0000 mg | ORAL_CAPSULE | Freq: Every day | ORAL | 0 refills | Status: DC

## 2018-12-19 MED FILL — guanFACINE HCL ER 2 MG TB24: 2 | 90 days supply | Qty: 90 | Fill #0

## 2018-12-19 MED FILL — VYVANSE 70 MG CAPSULE: 70 | 30 days supply | Qty: 30 | Fill #0

## 2018-12-19 MED FILL — DEXMETHYLPHENIDATE 10 MG TA: 10 | 30 days supply | Qty: 30 | Fill #0

## 2018-12-19 NOTE — Progress Notes (Signed)
Virtual Visit via Telephone Note  I connected with Harry Mcdonald on 12/19/18 at  9:00 AM EDT by telephone and verified that I am speaking with the correct person using two identifiers.   I discussed the limitations, risks, security and privacy concerns of performing an evaluation and management service by telephone and the availability of in person appointments. I also discussed with the patient that there may be a patient responsible charge related to this service. The patient expressed understanding and agreed to proceed.      I discussed the assessment and treatment plan with the patient. The patient was provided an opportunity to ask questions and all were answered. The patient agreed with the plan and demonstrated an understanding of the instructions.   The patient was advised to call back or seek an in-person evaluation if the symptoms worsen or if the condition fails to improve as anticipated.  I provided 15 minutes of non-face-to-face time during this encounter.   Diannia Ruder, MD  [facility] MD/PA/NP OP Progress Note  12/19/2018 9:22 AM Harry Mcdonald  MRN:  [id number]  Chief Complaint:  Chief Complaint    ADHD; Follow-up     HPI: This patient is a 23 year old male who lives between the homes of his divorced parents.  This summer he is staying with his father in [place].  His mother lives in [place].  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  The patient returns for follow-up regarding ADHD.  He is assessed on the phone due to the coronavirus pandemic.  He states that he failed 1 class this past semester and will have to retake it.  Other than that he had done fairly well.  He stated that was hard to finish up his classes online and sometimes did not understand things.  However he still excited about his major and wants to keep going with it.  He does feel that the Vyvanse and Focalin as well as Intuniv have helped him stay focused. Visit  Diagnosis:    ICD-10-CM   1. ADHD (attention deficit hyperactivity disorder), combined type F90.2 guanFACINE (INTUNIV) 2 MG TB24 ER tablet    lisdexamfetamine (VYVANSE) 70 MG capsule    Past Psychiatric History: none  Past Medical History:  Past Medical History:  Diagnosis Date  . ADHD (attention deficit hyperactivity disorder)   . Oppositional defiant disorder    History reviewed. No pertinent surgical history.  Family Psychiatric History: see below  Family History:  Family History  Problem Relation Age of Onset  . Depression Mother   . ADD / ADHD Mother   . ADD / ADHD Father   . Anxiety disorder Father   . Depression Father   . OCD Paternal Grandfather   . Depression Paternal Grandmother   . Seizures Paternal Grandmother        secondary to head trauma  . Physical abuse Paternal Grandmother   . Alcohol abuse Neg Hx   . Drug abuse Neg Hx   . Bipolar disorder Neg Hx   . Dementia Neg Hx   . Paranoid behavior Neg Hx   . Schizophrenia Neg Hx   . Sexual abuse Neg Hx     Social History:  Social History   Socioeconomic History  . Marital status: Single    Spouse name: Not on file  . Number of children: Not on file  . Years of education: Not on file  . Highest education level: Not on file  Occupational History  . Not on file  Social Needs  . Financial resource strain: Not on file  . Food insecurity:    Worry: Not on file    Inability: Not on file  . Transportation needs:    Medical: Not on file    Non-medical: Not on file  Tobacco Use  . Smoking status: Never Smoker  . Smokeless tobacco: Never Used  Substance and Sexual Activity  . Alcohol use: No  . Drug use: No  . Sexual activity: Not on file  Lifestyle  . Physical activity:    Days per week: Not on file    Minutes per session: Not on file  . Stress: Not on file  Relationships  . Social connections:    Talks on phone: Not on file    Gets together: Not on file    Attends religious service: Not on  file    Active member of club or organization: Not on file    Attends meetings of clubs or organizations: Not on file    Relationship status: Not on file  Other Topics Concern  . Not on file  Social History Narrative  . Not on file    Allergies: No Known Allergies  Metabolic Disorder Labs: No results found for: HGBA1C, MPG No results found for: PROLACTIN No results found for: CHOL, TRIG, HDL, CHOLHDL, VLDL, LDLCALC No results found for: TSH  Therapeutic Level Labs: No results found for: LITHIUM No results found for: VALPROATE No components found for:  CBMZ  Current Medications: Current Outpatient Medications  Medication Sig Dispense Refill  . dexmethylphenidate (FOCALIN) 10 MG tablet Take one after school 30 tablet 0  . dexmethylphenidate (FOCALIN) 10 MG tablet Take one after school 60 tablet 0  . dexmethylphenidate (FOCALIN) 10 MG tablet Take one after school 30 tablet 0  . dexmethylphenidate (FOCALIN) 10 MG tablet TAKE ONE TABLET BY MOUTH AFTER SCHOOL 30 tablet 0  . fexofenadine (ALLEGRA) 180 MG tablet Take 180 mg by mouth daily.    Marland Kitchen. guanFACINE (INTUNIV) 2 MG TB24 ER tablet Take 1 tablet (2 mg total) by mouth daily. 90 tablet 2  . lisdexamfetamine (VYVANSE) 70 MG capsule Take 1 capsule (70 mg total) by mouth every morning. 30 capsule 0  . lisdexamfetamine (VYVANSE) 70 MG capsule Take 1 capsule (70 mg total) by mouth daily. 30 capsule 0  . lisdexamfetamine (VYVANSE) 70 MG capsule Take 1 capsule (70 mg total) by mouth daily. 30 capsule 0   No current facility-administered medications for this visit.      Musculoskeletal: Strength & Muscle Tone: within normal limits Gait & Station: normal Patient leans: N/A  Psychiatric Specialty Exam: Review of Systems  All other systems reviewed and are negative.   There were no vitals taken for this visit.There is no height or weight on file to calculate BMI.  General Appearance: NA  Eye Contact:  NA  Speech:  Clear and Coherent   Volume:  Normal  Mood:  Euthymic  Affect:  Appropriate and Congruent  Thought Process:  Goal Directed  Orientation:  Full (Time, Place, and Person)  Thought Content: WDL   Suicidal Thoughts:  No  Homicidal Thoughts:  No  Memory:  Immediate;   Good Recent;   Good Remote;   Fair  Judgement:  Good  Insight:  Fair  Psychomotor Activity:  Normal  Concentration:  Concentration: Good and Attention Span: Good  Recall:  Good  Fund of Knowledge: Good  Language: Good  Akathisia:  No  Handed:  Right  AIMS (if indicated): not done  Assets:  Communication Skills Desire for Improvement Physical Health Resilience Social Support Talents/Skills  ADL's:  Intact  Cognition: WNL  Sleep:  Good   Screenings:    Assessment and Plan: This patient is a 23 year old male with a history of ADHD.  He is doing well on his current regimen.  He will continue 70 mg every morning for focus as well as Focalin 10 mg as needed later in the day also for focus and Intuniv 2 mg to help augment the other medications for ADHD.  He will return to see me in 6 months   Diannia Ruder, MD 12/19/2018, 9:22 AM

## 2019-01-20 MED FILL — DEXMETHYLPHENIDATE 10 MG TA: 10 | 30 days supply | Qty: 30 | Fill #0

## 2019-01-20 MED FILL — VYVANSE 70 MG CAPSULE: 70 | 30 days supply | Qty: 30 | Fill #0

## 2019-03-03 MED FILL — VYVANSE 70 MG CAPSULE: 70 | 30 days supply | Qty: 30 | Fill #0

## 2019-03-03 MED FILL — DEXMETHYLPHENIDATE 10 MG TA: 10 | 60 days supply | Qty: 60 | Fill #0

## 2019-03-25 MED FILL — guanFACINE HCL ER 2 MG TB24: 2 | 90 days supply | Qty: 90 | Fill #1

## 2019-04-02 ENCOUNTER — Other Ambulatory Visit (HOSPITAL_COMMUNITY): Payer: Self-pay | Admitting: Psychiatry

## 2019-04-02 MED FILL — VYVANSE 70 MG CAPSULE: 70 | 30 days supply | Qty: 30 | Fill #0

## 2019-04-03 MED FILL — DENTA 5000 PLUS CREAM: 1.1 | 30 days supply | Qty: 102 | Fill #0

## 2019-05-11 ENCOUNTER — Other Ambulatory Visit (HOSPITAL_COMMUNITY): Payer: Self-pay | Admitting: Psychiatry

## 2019-05-11 MED FILL — VYVANSE 70 MG CAPSULE: 70 | 30 days supply | Qty: 30 | Fill #0

## 2019-05-11 MED FILL — DEXMETHYLPHENIDATE 10 MG TA: 10 | 60 days supply | Qty: 60 | Fill #0

## 2019-06-22 ENCOUNTER — Other Ambulatory Visit: Payer: Self-pay

## 2019-06-22 ENCOUNTER — Encounter (HOSPITAL_COMMUNITY): Payer: Self-pay | Admitting: Psychiatry

## 2019-06-22 ENCOUNTER — Ambulatory Visit (INDEPENDENT_AMBULATORY_CARE_PROVIDER_SITE_OTHER): Payer: No Typology Code available for payment source | Admitting: Psychiatry

## 2019-06-22 DIAGNOSIS — F902 Attention-deficit hyperactivity disorder, combined type: Secondary | ICD-10-CM

## 2019-06-22 MED ORDER — DEXMETHYLPHENIDATE HCL 10 MG PO TABS: ORAL_TABLET | ORAL | 0 refills | Status: DC

## 2019-06-22 MED ORDER — GUANFACINE HCL ER 2 MG PO TB24: 2.0000 mg | ORAL_TABLET | Freq: Every day | ORAL | 2 refills | Status: DC

## 2019-06-22 MED ORDER — LISDEXAMFETAMINE DIMESYLATE 70 MG PO CAPS: 70.0000 mg | ORAL_CAPSULE | ORAL | 0 refills | Status: DC

## 2019-06-22 MED ORDER — LISDEXAMFETAMINE DIMESYLATE 70 MG PO CAPS: 70.0000 mg | ORAL_CAPSULE | Freq: Every day | ORAL | 0 refills | Status: DC

## 2019-06-22 NOTE — Progress Notes (Signed)
Virtual Visit via Telephone Note  I connected with Harry Mcdonald on 06/22/19 at  1:00 PM EST by telephone and verified that I am speaking with the correct person using two identifiers.   I discussed the limitations, risks, security and privacy concerns of performing an evaluation and management service by telephone and the availability of in person appointments. I also discussed with the patient that there may be a patient responsible charge related to this service. The patient expressed understanding and agreed to proceed.     I discussed the assessment and treatment plan with the patient. The patient was provided an opportunity to ask questions and all were answered. The patient agreed with the plan and demonstrated an understanding of the instructions.   The patient was advised to call back or seek an in-person evaluation if the symptoms worsen or if the condition fails to improve as anticipated.  I provided 15 minutes of non-face-to-face time during this encounter.   Diannia Ruder, MD  [facility] MD/PA/NP OP Progress Note  06/22/2019 1:26 PM Harry Mcdonald  MRN:  [id number]  Chief Complaint:  Chief Complaint    ADHD; Follow-up     HPI: This patient is a 23 year old male who lives between the homes of his divorced parents.  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  The patient returns for follow-up regarding ADHD.  He states he is doing fairly well although it has been very challenging try to do all all of his classes online.  He probably has about a year left to finish his degree.  He states that when pandemic started he missed some days of medication and he started to feel depressed and unfocused so he is gone back on it.  He does feel that the Vyvanse and Focalin as well as Intuniv helps him stay focused.  He denies being depressed. Visit Diagnosis:    ICD-10-CM   1. ADHD (attention deficit hyperactivity disorder), combined type  F90.2 lisdexamfetamine  (VYVANSE) 70 MG capsule    guanFACINE (INTUNIV) 2 MG TB24 ER tablet    Past Psychiatric History: none  Past Medical History:  Past Medical History:  Diagnosis Date  . ADHD (attention deficit hyperactivity disorder)   . Oppositional defiant disorder    History reviewed. No pertinent surgical history.  Family Psychiatric History: see below  Family History:  Family History  Problem Relation Age of Onset  . Depression Mother   . ADD / ADHD Mother   . ADD / ADHD Father   . Anxiety disorder Father   . Depression Father   . OCD Paternal Grandfather   . Depression Paternal Grandmother   . Seizures Paternal Grandmother        secondary to head trauma  . Physical abuse Paternal Grandmother   . Alcohol abuse Neg Hx   . Drug abuse Neg Hx   . Bipolar disorder Neg Hx   . Dementia Neg Hx   . Paranoid behavior Neg Hx   . Schizophrenia Neg Hx   . Sexual abuse Neg Hx     Social History:  Social History   Socioeconomic History  . Marital status: Single    Spouse name: Not on file  . Number of children: Not on file  . Years of education: Not on file  . Highest education level: Not on file  Occupational History  . Not on file  Social Needs  . Financial resource strain: Not on file  . Food insecurity  Worry: Not on file    Inability: Not on file  . Transportation needs    Medical: Not on file    Non-medical: Not on file  Tobacco Use  . Smoking status: Never Smoker  . Smokeless tobacco: Never Used  Substance and Sexual Activity  . Alcohol use: No  . Drug use: No  . Sexual activity: Not on file  Lifestyle  . Physical activity    Days per week: Not on file    Minutes per session: Not on file  . Stress: Not on file  Relationships  . Social Herbalist on phone: Not on file    Gets together: Not on file    Attends religious service: Not on file    Active member of club or organization: Not on file    Attends meetings of clubs or organizations: Not on file     Relationship status: Not on file  Other Topics Concern  . Not on file  Social History Narrative  . Not on file    Allergies: No Known Allergies  Metabolic Disorder Labs: No results found for: HGBA1C, MPG No results found for: PROLACTIN No results found for: CHOL, TRIG, HDL, CHOLHDL, VLDL, LDLCALC No results found for: TSH  Therapeutic Level Labs: No results found for: LITHIUM No results found for: VALPROATE No components found for:  CBMZ  Current Medications: Current Outpatient Medications  Medication Sig Dispense Refill  . dexmethylphenidate (FOCALIN) 10 MG tablet Take one after school 30 tablet 0  . dexmethylphenidate (FOCALIN) 10 MG tablet Take one after school 30 tablet 0  . dexmethylphenidate (FOCALIN) 10 MG tablet TAKE ONE TABLET BY MOUTH AFTER SCHOOL 30 tablet 0  . fexofenadine (ALLEGRA) 180 MG tablet Take 180 mg by mouth daily.    Marland Kitchen guanFACINE (INTUNIV) 2 MG TB24 ER tablet Take 1 tablet (2 mg total) by mouth daily. 90 tablet 2  . lisdexamfetamine (VYVANSE) 70 MG capsule Take 1 capsule (70 mg total) by mouth daily. 30 capsule 0  . lisdexamfetamine (VYVANSE) 70 MG capsule Take 1 capsule (70 mg total) by mouth every morning. 30 capsule 0  . lisdexamfetamine (VYVANSE) 70 MG capsule Take 1 capsule (70 mg total) by mouth daily. 30 capsule 0   No current facility-administered medications for this visit.      Musculoskeletal: Strength & Muscle Tone: within normal limits Gait & Station: normal Patient leans: N/A  Psychiatric Specialty Exam: Review of Systems  All other systems reviewed and are negative.   There were no vitals taken for this visit.There is no height or weight on file to calculate BMI.  General Appearance: NA  Eye Contact:  NA  Speech:  Clear and Coherent  Volume:  Normal  Mood:  Euthymic  Affect:  NA  Thought Process:  Goal Directed  Orientation:  Full (Time, Place, and Person)  Thought Content: WDL   Suicidal Thoughts:  No  Homicidal Thoughts:   No  Memory:  Immediate;   Good Recent;   Good Remote;   Good  Judgement:  Good  Insight:  Fair  Psychomotor Activity:  Normal  Concentration:  Concentration: Good and Attention Span: Good  Recall:  Good  Fund of Knowledge: Good  Language: Good  Akathisia:  No  Handed:  Right  AIMS (if indicated): not done  Assets:  Communication Skills Desire for Improvement Physical Health Resilience Social Support Talents/Skills Vocational/Educational  ADL's:  Intact  Cognition: WNL  Sleep:  Good   Screenings:  Assessment and Plan: This patient is a 23 year old male with a history of ADHD.  He is doing well on his current regimen.  He will continue Vyvanse 70 mg every morning as well as Focalin 10 mg in the afternoon for focus and Intuniv 2 mg daily also for ADHD.  He will return to see me in 6 months   Diannia Rudereborah Lesa Vandall, MD 06/22/2019, 1:26 PM

## 2019-06-23 ENCOUNTER — Other Ambulatory Visit (HOSPITAL_COMMUNITY): Payer: Self-pay | Admitting: Psychiatry

## 2019-06-23 ENCOUNTER — Telehealth (HOSPITAL_COMMUNITY): Payer: Self-pay | Admitting: *Deleted

## 2019-06-23 DIAGNOSIS — F902 Attention-deficit hyperactivity disorder, combined type: Secondary | ICD-10-CM

## 2019-06-23 MED ORDER — LISDEXAMFETAMINE DIMESYLATE 70 MG PO CAPS: 70.0000 mg | ORAL_CAPSULE | Freq: Every day | ORAL | 0 refills | Status: DC

## 2019-06-23 MED ORDER — DEXMETHYLPHENIDATE HCL 10 MG PO TABS: ORAL_TABLET | ORAL | 0 refills | Status: DC

## 2019-06-23 MED ORDER — LISDEXAMFETAMINE DIMESYLATE 70 MG PO CAPS: 70.0000 mg | ORAL_CAPSULE | ORAL | 0 refills | Status: DC

## 2019-06-23 MED ORDER — GUANFACINE HCL ER 2 MG PO TB24: 2.0000 mg | ORAL_TABLET | Freq: Every day | ORAL | 2 refills | Status: DC

## 2019-06-23 MED FILL — guanFACINE HCL ER 2 MG TB24: 2 | 90 days supply | Qty: 90 | Fill #0

## 2019-06-23 MED FILL — VYVANSE 70 MG CAPSULE: 70 | 30 days supply | Qty: 30 | Fill #0

## 2019-06-23 NOTE — Telephone Encounter (Signed)
sent 

## 2019-06-23 NOTE — Telephone Encounter (Signed)
PATIENT CALLED & STATED HE FAILED TO MENTION MOM PREFERS MED REFILLS GO TO [facility]. UPDATED IN EPIC CURRENT &  REMOVED [facility] Rx

## 2019-08-03 MED FILL — VYVANSE 70 MG CAPSULE: 70 | 30 days supply | Qty: 30 | Fill #0

## 2019-09-03 MED FILL — VYVANSE 70 MG CAPSULE: 70 | 30 days supply | Qty: 30 | Fill #0

## 2019-10-08 ENCOUNTER — Other Ambulatory Visit (HOSPITAL_COMMUNITY): Payer: Self-pay | Admitting: Psychiatry

## 2019-10-08 DIAGNOSIS — F902 Attention-deficit hyperactivity disorder, combined type: Secondary | ICD-10-CM

## 2019-10-08 MED ORDER — LISDEXAMFETAMINE DIMESYLATE 70 MG PO CAPS: 70.0000 mg | ORAL_CAPSULE | Freq: Every day | ORAL | 0 refills | Status: DC

## 2019-10-08 MED ORDER — LISDEXAMFETAMINE DIMESYLATE 70 MG PO CAPS: 70.0000 mg | ORAL_CAPSULE | ORAL | 0 refills | Status: DC

## 2019-10-08 MED FILL — VYVANSE 70 MG CAPSULE: 70 | 30 days supply | Qty: 30 | Fill #0

## 2019-10-08 NOTE — Telephone Encounter (Signed)
PATIENT CALLED REQUESTED REFILL AFTER RX SAID 0 REFILLS ON FILE/HOLD lisdexamfetamine (VYVANSE) 70 MG capsule  NEXT APPT IS  12/22/19

## 2019-11-10 MED FILL — VYVANSE 70 MG CAPSULE: 70 | 30 days supply | Qty: 30 | Fill #0

## 2019-12-16 MED FILL — VYVANSE 70 MG CAPSULE: 70 | 30 days supply | Qty: 30 | Fill #0

## 2019-12-16 MED FILL — guanFACINE HCL ER 2 MG TB24: 2 | 90 days supply | Qty: 90 | Fill #1

## 2019-12-22 ENCOUNTER — Encounter (HOSPITAL_COMMUNITY): Payer: Self-pay | Admitting: Psychiatry

## 2019-12-22 ENCOUNTER — Other Ambulatory Visit (HOSPITAL_COMMUNITY): Payer: Self-pay | Admitting: Psychiatry

## 2019-12-22 ENCOUNTER — Other Ambulatory Visit: Payer: Self-pay

## 2019-12-22 ENCOUNTER — Telehealth (INDEPENDENT_AMBULATORY_CARE_PROVIDER_SITE_OTHER): Payer: No Typology Code available for payment source | Admitting: Psychiatry

## 2019-12-22 ENCOUNTER — Encounter (HOSPITAL_COMMUNITY): Payer: Self-pay

## 2019-12-22 DIAGNOSIS — F902 Attention-deficit hyperactivity disorder, combined type: Secondary | ICD-10-CM

## 2019-12-22 MED ORDER — LISDEXAMFETAMINE DIMESYLATE 70 MG PO CAPS: 70.0000 mg | ORAL_CAPSULE | Freq: Every day | ORAL | 0 refills | Status: DC

## 2019-12-22 MED ORDER — GUANFACINE HCL ER 2 MG PO TB24: 2.0000 mg | ORAL_TABLET | Freq: Every day | ORAL | 2 refills | Status: DC

## 2019-12-22 MED ORDER — DEXMETHYLPHENIDATE HCL 10 MG PO TABS: ORAL_TABLET | ORAL | 0 refills | Status: DC

## 2019-12-22 MED ORDER — LISDEXAMFETAMINE DIMESYLATE 70 MG PO CAPS: 70.0000 mg | ORAL_CAPSULE | ORAL | 0 refills | Status: DC

## 2019-12-22 MED FILL — DEXMETHYLPHENIDATE 10 MG TA: 10 | 30 days supply | Qty: 30 | Fill #0

## 2019-12-22 NOTE — Progress Notes (Signed)
Virtual Visit via Video Note  I connected with Harry Mcdonald on 12/22/19 at  1:00 PM EDT by a video enabled telemedicine application and verified that I am speaking with the correct person using two identifiers.   I discussed the limitations of evaluation and management by telemedicine and the availability of in person appointments. The patient expressed understanding and agreed to proceed.    I discussed the assessment and treatment plan with the patient. The patient was provided an opportunity to ask questions and all were answered. The patient agreed with the plan and demonstrated an understanding of the instructions.   The patient was advised to call back or seek an in-person evaluation if the symptoms worsen or if the condition fails to improve as anticipated.  I provided 15 minutes of non-face-to-face time during this encounter. Location: Provider office, patient home  Diannia Ruder, MD  [facility] MD/PA/NP OP Progress Note  12/22/2019 1:22 PM Harry Mcdonald  MRN:  [id number]  Chief Complaint:  Chief Complaint    ADHD; Follow-up     HPI: This patient is a 24 year old male who lives between the homes of his divorced parents.  He is currently staying in [place] with his father for the summer.  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  The patient continues to do well.  He is following up today regarding his ADHD.  He did well on his courses once he got into to [place] taking everything online.  He has 3 online courses this summer and is also going to try to work.  He does think the medications help.  Sometimes when he is on vacation or not working he only uses the [place] and I think this is fine.  He is sleeping well and denies being depressed. Visit Diagnosis:    ICD-10-CM   1. ADHD (attention deficit hyperactivity disorder), combined type  F90.2 guanFACINE (INTUNIV) 2 MG TB24 ER tablet    lisdexamfetamine (VYVANSE) 70 MG capsule    Past  Psychiatric History: none  Past Medical History:  Past Medical History:  Diagnosis Date  . ADHD (attention deficit hyperactivity disorder)   . Oppositional defiant disorder    History reviewed. No pertinent surgical history.  Family Psychiatric History: see below  Family History:  Family History  Problem Relation Age of Onset  . Depression Mother   . ADD / ADHD Mother   . ADD / ADHD Father   . Anxiety disorder Father   . Depression Father   . OCD Paternal Grandfather   . Depression Paternal Grandmother   . Seizures Paternal Grandmother        secondary to head trauma  . Physical abuse Paternal Grandmother   . Alcohol abuse Neg Hx   . Drug abuse Neg Hx   . Bipolar disorder Neg Hx   . Dementia Neg Hx   . Paranoid behavior Neg Hx   . Schizophrenia Neg Hx   . Sexual abuse Neg Hx     Social History:  Social History   Socioeconomic History  . Marital status: Single    Spouse name: Not on file  . Number of children: Not on file  . Years of education: Not on file  . Highest education level: Not on file  Occupational History  . Not on file  Tobacco Use  . Smoking status: Never Smoker  . Smokeless tobacco: Never Used  Substance and Sexual Activity  . Alcohol use: No  . Drug use:  No  . Sexual activity: Not on file  Other Topics Concern  . Not on file  Social History Narrative  . Not on file   Social Determinants of Health   Financial Resource Strain:   . Difficulty of Paying Living Expenses:   Food Insecurity:   . Worried About Charity fundraiser in the Last Year:   . Arboriculturist in the Last Year:   Transportation Needs:   . Film/video editor (Medical):   Marland Kitchen Lack of Transportation (Non-Medical):   Physical Activity:   . Days of Exercise per Week:   . Minutes of Exercise per Session:   Stress:   . Feeling of Stress :   Social Connections:   . Frequency of Communication with Friends and Family:   . Frequency of Social Gatherings with Friends and  Family:   . Attends Religious Services:   . Active Member of Clubs or Organizations:   . Attends Archivist Meetings:   Marland Kitchen Marital Status:     Allergies: No Known Allergies  Metabolic Disorder Labs: No results found for: HGBA1C, MPG No results found for: PROLACTIN No results found for: CHOL, TRIG, HDL, CHOLHDL, VLDL, LDLCALC No results found for: TSH  Therapeutic Level Labs: No results found for: LITHIUM No results found for: VALPROATE No components found for:  CBMZ  Current Medications: Current Outpatient Medications  Medication Sig Dispense Refill  . dexmethylphenidate (FOCALIN) 10 MG tablet Take one after school 30 tablet 0  . dexmethylphenidate (FOCALIN) 10 MG tablet Take one after school 30 tablet 0  . dexmethylphenidate (FOCALIN) 10 MG tablet TAKE ONE TABLET BY MOUTH AFTER SCHOOL 30 tablet 0  . fexofenadine (ALLEGRA) 180 MG tablet Take 180 mg by mouth daily.    Marland Kitchen guanFACINE (INTUNIV) 2 MG TB24 ER tablet Take 1 tablet (2 mg total) by mouth daily. 90 tablet 2  . lisdexamfetamine (VYVANSE) 70 MG capsule Take 1 capsule (70 mg total) by mouth every morning. 30 capsule 0  . lisdexamfetamine (VYVANSE) 70 MG capsule Take 1 capsule (70 mg total) by mouth daily. 30 capsule 0  . lisdexamfetamine (VYVANSE) 70 MG capsule Take 1 capsule (70 mg total) by mouth daily. 30 capsule 0   No current facility-administered medications for this visit.     Musculoskeletal: Strength & Muscle Tone: within normal limits Gait & Station: normal Patient leans: N/A  Psychiatric Specialty Exam: Review of Systems  All other systems reviewed and are negative.   There were no vitals taken for this visit.There is no height or weight on file to calculate BMI.  General Appearance: Casual and Fairly Groomed  Eye Contact:  Good  Speech:  Clear and Coherent  Volume:  Normal  Mood:  Euthymic  Affect:  Appropriate and Congruent  Thought Process:  Goal Directed  Orientation:  Full (Time, Place,  and Person)  Thought Content: WDL   Suicidal Thoughts:  No  Homicidal Thoughts:  No  Memory:  Immediate;   Good Recent;   Good Remote;   Good  Judgement:  Good  Insight:  Fair  Psychomotor Activity:  Normal  Concentration:  Concentration: Good and Attention Span: Good  Recall:  Good  Fund of Knowledge: Good  Language: Good  Akathisia:  No  Handed:  Right  AIMS (if indicated): not done  Assets:  Communication Skills Desire for Improvement Physical Health Resilience Social Support Talents/Skills Vocational/Educational  ADL's:  Intact  Cognition: WNL  Sleep:  Good   Screenings:  Assessment and Plan: This patient is a 24 year old male with a history of ADHD.  He continues to do well on his current regimen.  He will continue Vyvanse 70 mg every morning as well as Focalin 10 mg at the afternoon for focus and Intuniv 2 mg daily also for ADHD.  He will return to see me in 6 months   Diannia Ruder, MD 12/22/2019, 1:22 PM

## 2020-01-26 MED FILL — VYVANSE 70 MG CAPSULE: 70 | 30 days supply | Qty: 30 | Fill #0

## 2020-02-24 MED FILL — VYVANSE 70 MG CAPSULE: 70 | 30 days supply | Qty: 30 | Fill #0

## 2020-04-01 MED FILL — VYVANSE 70 MG CAPSULE: 70 | 30 days supply | Qty: 30 | Fill #0

## 2020-05-05 ENCOUNTER — Other Ambulatory Visit (HOSPITAL_COMMUNITY): Payer: Self-pay | Admitting: Psychiatry

## 2020-05-05 DIAGNOSIS — F902 Attention-deficit hyperactivity disorder, combined type: Secondary | ICD-10-CM

## 2020-05-05 MED FILL — guanFACINE HCL ER 2 MG TB24: 2 | 90 days supply | Qty: 90 | Fill #0

## 2020-05-05 MED FILL — DEXMETHYLPHENIDATE 10 MG TA: 10 | 30 days supply | Qty: 30 | Fill #0

## 2020-05-09 ENCOUNTER — Other Ambulatory Visit (HOSPITAL_COMMUNITY): Payer: Self-pay | Admitting: Psychiatry

## 2020-05-09 MED FILL — VYVANSE 70 MG CAPSULE: 70 | 30 days supply | Qty: 30 | Fill #0

## 2020-06-07 ENCOUNTER — Other Ambulatory Visit (HOSPITAL_COMMUNITY): Payer: Self-pay | Admitting: Psychiatry

## 2020-06-07 DIAGNOSIS — F902 Attention-deficit hyperactivity disorder, combined type: Secondary | ICD-10-CM

## 2020-06-07 MED FILL — VYVANSE 70 MG CAPSULE: 70 | 30 days supply | Qty: 30 | Fill #0

## 2020-07-07 ENCOUNTER — Other Ambulatory Visit: Payer: Self-pay

## 2020-07-07 ENCOUNTER — Other Ambulatory Visit (HOSPITAL_COMMUNITY): Payer: Self-pay | Admitting: Psychiatry

## 2020-07-07 ENCOUNTER — Encounter (HOSPITAL_COMMUNITY): Payer: Self-pay | Admitting: Psychiatry

## 2020-07-07 ENCOUNTER — Telehealth (INDEPENDENT_AMBULATORY_CARE_PROVIDER_SITE_OTHER): Payer: No Typology Code available for payment source | Admitting: Psychiatry

## 2020-07-07 DIAGNOSIS — F902 Attention-deficit hyperactivity disorder, combined type: Secondary | ICD-10-CM

## 2020-07-07 MED ORDER — LISDEXAMFETAMINE DIMESYLATE 70 MG PO CAPS: 70.0000 mg | ORAL_CAPSULE | Freq: Every day | ORAL | 0 refills | Status: DC

## 2020-07-07 MED ORDER — LISDEXAMFETAMINE DIMESYLATE 70 MG PO CAPS
70.0000 mg | ORAL_CAPSULE | Freq: Every day | ORAL | 0 refills | Status: DC
Start: 2020-07-07 — End: 2020-10-17

## 2020-07-07 MED ORDER — LISDEXAMFETAMINE DIMESYLATE 70 MG PO CAPS
70.0000 mg | ORAL_CAPSULE | ORAL | 0 refills | Status: DC
Start: 2020-07-07 — End: 2020-07-07

## 2020-07-07 MED ORDER — DEXMETHYLPHENIDATE HCL 10 MG PO TABS
ORAL_TABLET | ORAL | 0 refills | Status: DC
Start: 2020-07-07 — End: 2020-11-09

## 2020-07-07 MED ORDER — GUANFACINE HCL ER 2 MG PO TB24: 2.0000 mg | ORAL_TABLET | Freq: Every day | ORAL | 2 refills | Status: DC

## 2020-07-07 MED ORDER — DEXMETHYLPHENIDATE HCL 10 MG PO TABS
ORAL_TABLET | ORAL | 0 refills | Status: DC
Start: 2020-07-07 — End: 2020-07-07

## 2020-07-07 MED FILL — DEXMETHYLPHENIDATE 10 MG TA: 10 | 30 days supply | Qty: 30 | Fill #0

## 2020-07-07 MED FILL — VYVANSE 70 MG CAPSULE: 70 | 30 days supply | Qty: 30 | Fill #0

## 2020-07-07 NOTE — Progress Notes (Signed)
Virtual Visit via Telephone Note  I connected with Harry Mcdonald on 07/07/20 at  1:00 PM EST by telephone and verified that I am speaking with the correct person using two identifiers.  Location: Patient: home Provider: office   I discussed the limitations, risks, security and privacy concerns of performing an evaluation and management service by telephone and the availability of in person appointments. I also discussed with the patient that there may be a patient responsible charge related to this service. The patient expressed understanding and agreed to proceed    I discussed the assessment and treatment plan with the patient. The patient was provided an opportunity to ask questions and all were answered. The patient agreed with the plan and demonstrated an understanding of the instructions.   The patient was advised to call back or seek an in-person evaluation if the symptoms worsen or if the condition fails to improve as anticipated.  I provided 15 minutes of non-face-to-face time during this encounter.   Diannia Ruder, MD  [facility] MD/PA/NP OP Progress Note  07/07/2020 1:18 PM Harry Mcdonald  MRN:  [id number]  Chief Complaint:  Chief Complaint    ADHD; Follow-up     HPI: This patient is a 24 year old male who lives between the homes of his divorced parents.  He is currently staying in [place] with his father for the summer.  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  He is still doing well in school and is slated to graduate this coming spring.  He is starting to work on his resume and look for jobs.  He is hopeful that they will find something interesting.  His mood has been good and his focus has been excellent during college and is making good grades.  He is sleeping well most of the time. Visit Diagnosis:    ICD-10-CM   1. ADHD (attention deficit hyperactivity disorder), combined type  F90.2  guanFACINE (INTUNIV) 2 MG TB24 ER tablet    lisdexamfetamine (VYVANSE) 70 MG capsule    Past Psychiatric History: none  Past Medical History:  Past Medical History:  Diagnosis Date  . ADHD (attention deficit hyperactivity disorder)   . Oppositional defiant disorder    History reviewed. No pertinent surgical history.  Family Psychiatric History: see below  Family History:  Family History  Problem Relation Age of Onset  . Depression Mother   . ADD / ADHD Mother   . ADD / ADHD Father   . Anxiety disorder Father   . Depression Father   . OCD Paternal Grandfather   . Depression Paternal Grandmother   . Seizures Paternal Grandmother        secondary to head trauma  . Physical abuse Paternal Grandmother   . Alcohol abuse Neg Hx   . Drug abuse Neg Hx   . Bipolar disorder Neg Hx   . Dementia Neg Hx   . Paranoid behavior Neg Hx   . Schizophrenia Neg Hx   . Sexual abuse Neg Hx     Social History:  Social History   Socioeconomic History  . Marital status: Single    Spouse name: Not on file  . Number of children: Not on file  . Years of education: Not on file  . Highest education level: Not on file  Occupational History  . Not on file  Tobacco Use  . Smoking status: Never Smoker  . Smokeless tobacco: Never  Used  Substance and Sexual Activity  . Alcohol use: No  . Drug use: No  . Sexual activity: Not on file  Other Topics Concern  . Not on file  Social History Narrative  . Not on file   Social Determinants of Health   Financial Resource Strain: Not on file  Food Insecurity: Not on file  Transportation Needs: Not on file  Physical Activity: Not on file  Stress: Not on file  Social Connections: Not on file    Allergies: No Known Allergies  Metabolic Disorder Labs: No results found for: HGBA1C, MPG No results found for: PROLACTIN No results found for: CHOL, TRIG, HDL, CHOLHDL, VLDL, LDLCALC No results found for: TSH  Therapeutic Level Labs: No results  found for: LITHIUM No results found for: VALPROATE No components found for:  CBMZ  Current Medications: Current Outpatient Medications  Medication Sig Dispense Refill  . dexmethylphenidate (FOCALIN) 10 MG tablet Take one after school 30 tablet 0  . dexmethylphenidate (FOCALIN) 10 MG tablet Take one after school 30 tablet 0  . dexmethylphenidate (FOCALIN) 10 MG tablet TAKE ONE TABLET BY MOUTH AFTER SCHOOL 30 tablet 0  . fexofenadine (ALLEGRA) 180 MG tablet Take 180 mg by mouth daily.    Marland Kitchen guanFACINE (INTUNIV) 2 MG TB24 ER tablet Take 1 tablet (2 mg total) by mouth daily. 90 tablet 2  . lisdexamfetamine (VYVANSE) 70 MG capsule Take 1 capsule (70 mg total) by mouth daily. 30 capsule 0  . lisdexamfetamine (VYVANSE) 70 MG capsule Take 1 capsule (70 mg total) by mouth every morning. 30 capsule 0  . lisdexamfetamine (VYVANSE) 70 MG capsule Take 1 capsule (70 mg total) by mouth daily. 30 capsule 0   No current facility-administered medications for this visit.     Musculoskeletal: Strength & Muscle Tone: within normal limits Gait & Station: normal Patient leans: N/A  Psychiatric Specialty Exam: Review of Systems  All other systems reviewed and are negative.   There were no vitals taken for this visit.There is no height or weight on file to calculate BMI.  General Appearance: NA  Eye Contact:  NA  Speech:  Clear and Coherent  Volume:  Normal  Mood:  Euthymic  Affect:  NA  Thought Process:  Goal Directed  Orientation:  Full (Time, Place, and Person)  Thought Content: WDL   Suicidal Thoughts:  No  Homicidal Thoughts:  No  Memory:  Immediate;   Good Recent;   Good Remote;   Good  Judgement:  Good  Insight:  Good  Psychomotor Activity:  Normal  Concentration:  Concentration: Good and Attention Span: Good  Recall:  Good  Fund of Knowledge: Good  Language: Good  Akathisia:  No  Handed:  Right  AIMS (if indicated): not done  Assets:  Communication Skills Desire for  Improvement Physical Health Resilience Social Support Talents/Skills Vocational/Educational  ADL's:  Intact  Cognition: WNL  Sleep:  Good   Screenings:   Assessment and Plan: This patient is a 24 year old male with a history of ADHD.  He continues to do very well on his current regimen.  He will continue Vyvanse 70 mg every morning, Focalin 10 mg in the afternoon for focus as well as Intuniv 2 mg daily also for ADHD.  He will return to see me in 6 months   Diannia Ruder, MD 07/07/2020, 1:18 PM

## 2020-08-10 MED FILL — guanFACINE HCL ER 2 MG TB24: 2 | 90 days supply | Qty: 90 | Fill #1

## 2020-08-10 MED FILL — VYVANSE 70 MG CAPSULE: 70 | 30 days supply | Qty: 30 | Fill #0

## 2020-08-30 ENCOUNTER — Telehealth (HOSPITAL_COMMUNITY): Payer: Self-pay | Admitting: Psychiatry

## 2020-08-30 NOTE — Telephone Encounter (Signed)
Called to schedule f/u appt, left vm 

## 2020-09-12 MED FILL — VYVANSE 70 MG CAPSULE: 70 | 30 days supply | Qty: 30 | Fill #0

## 2020-10-15 ENCOUNTER — Other Ambulatory Visit (HOSPITAL_COMMUNITY): Payer: Self-pay | Admitting: Psychiatry

## 2020-10-17 ENCOUNTER — Other Ambulatory Visit (HOSPITAL_COMMUNITY): Payer: Self-pay | Admitting: Psychiatry

## 2020-10-17 MED FILL — VYVANSE 70 MG CAPSULE: 70 | 30 days supply | Qty: 30 | Fill #0

## 2020-10-25 ENCOUNTER — Other Ambulatory Visit (HOSPITAL_COMMUNITY): Payer: Self-pay

## 2020-10-27 ENCOUNTER — Other Ambulatory Visit (HOSPITAL_COMMUNITY): Payer: Self-pay

## 2020-11-09 ENCOUNTER — Other Ambulatory Visit (HOSPITAL_COMMUNITY): Payer: Self-pay

## 2020-11-09 ENCOUNTER — Telehealth (INDEPENDENT_AMBULATORY_CARE_PROVIDER_SITE_OTHER): Payer: No Typology Code available for payment source | Admitting: Psychiatry

## 2020-11-09 ENCOUNTER — Encounter (HOSPITAL_COMMUNITY): Payer: Self-pay | Admitting: Psychiatry

## 2020-11-09 ENCOUNTER — Other Ambulatory Visit: Payer: Self-pay

## 2020-11-09 DIAGNOSIS — F902 Attention-deficit hyperactivity disorder, combined type: Secondary | ICD-10-CM | POA: Diagnosis not present

## 2020-11-09 MED ORDER — DEXMETHYLPHENIDATE HCL 10 MG PO TABS
ORAL_TABLET | ORAL | 0 refills | Status: DC
  Filled 2020-11-09: qty 30, fill #0
  Filled 2021-02-23: qty 30, 30d supply, fill #0

## 2020-11-09 MED ORDER — LISDEXAMFETAMINE DIMESYLATE 50 MG PO CAPS
50.0000 mg | ORAL_CAPSULE | Freq: Every day | ORAL | 0 refills | Status: DC
  Filled 2020-11-09 – 2021-02-23 (×2): qty 30, 30d supply, fill #0

## 2020-11-09 MED ORDER — DEXMETHYLPHENIDATE HCL 10 MG PO TABS
ORAL_TABLET | ORAL | 0 refills | Status: DC
  Filled 2020-11-09: qty 30, 30d supply, fill #0

## 2020-11-09 MED ORDER — DEXMETHYLPHENIDATE HCL 10 MG PO TABS
ORAL_TABLET | ORAL | 0 refills | Status: DC
  Filled 2020-11-09: qty 30, fill #0
  Filled 2021-01-10: qty 30, 30d supply, fill #0

## 2020-11-09 MED ORDER — GUANFACINE HCL ER 2 MG PO TB24
2.0000 mg | ORAL_TABLET | Freq: Every day | ORAL | 2 refills | Status: DC
  Filled 2020-11-09: qty 90, 90d supply, fill #0
  Filled 2021-04-12: qty 90, 90d supply, fill #1

## 2020-11-09 MED ORDER — LISDEXAMFETAMINE DIMESYLATE 50 MG PO CAPS
50.0000 mg | ORAL_CAPSULE | Freq: Every day | ORAL | 0 refills | Status: DC
  Filled 2020-11-09: qty 30, 30d supply, fill #0

## 2020-11-09 MED ORDER — LISDEXAMFETAMINE DIMESYLATE 50 MG PO CAPS
50.0000 mg | ORAL_CAPSULE | Freq: Every day | ORAL | 0 refills | Status: DC
  Filled 2020-11-09 – 2021-01-10 (×2): qty 30, 30d supply, fill #0

## 2020-11-09 NOTE — Progress Notes (Signed)
Virtual Visit via Video Note  I connected with Harry Mcdonald on 11/09/20 at  1:20 PM EDT by a video enabled telemedicine application and verified that I am speaking with the correct person using two identifiers.  Location: Patient: home Provider:home office   I discussed the limitations of evaluation and management by telemedicine and the availability of in person appointments. The patient expressed understanding and agreed to proceed    I discussed the assessment and treatment plan with the patient. The patient was provided an opportunity to ask questions and all were answered. The patient agreed with the plan and demonstrated an understanding of the instructions.   The patient was advised to call back or seek an in-person evaluation if the symptoms worsen or if the condition fails to improve as anticipated.  I provided 15 minutes of non-face-to-face time during this encounter.   Diannia Ruder, MD  [facility] MD/PA/NP OP Progress Note  11/09/2020 1:46 PM Harry Mcdonald  MRN:  [id number]  Chief Complaint:  Chief Complaint    ADHD; Follow-up     HPI: This patient is a 25 year old male who lives between the homes of his divorced parents.  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  The patient returns for follow-up after about 4 months.  He states that he is about to graduate next month.  He would like to lower his dose of Vyvanse because it makes him feel too quiet and hyper focused.  He has been looking for various jobs and would like to return to the [place].  I suggest we go down from 70 to 50 mg and he is in agreement.  He sometimes uses the Focalin as well if he needs to focus later in the day.  He always takes the Intuniv with 1 of these medications. Visit Diagnosis:    ICD-10-CM   1. ADHD (attention deficit hyperactivity disorder), combined type  F90.2 guanFACINE (INTUNIV) 2 MG TB24 ER tablet    dexmethylphenidate (FOCALIN) 10 MG tablet    Past  Psychiatric History: none  Past Medical History:  Past Medical History:  Diagnosis Date  . ADHD (attention deficit hyperactivity disorder)   . Oppositional defiant disorder    History reviewed. No pertinent surgical history.  Family Psychiatric History: See below  Family History:  Family History  Problem Relation Age of Onset  . Depression Mother   . ADD / ADHD Mother   . ADD / ADHD Father   . Anxiety disorder Father   . Depression Father   . OCD Paternal Grandfather   . Depression Paternal Grandmother   . Seizures Paternal Grandmother        secondary to head trauma  . Physical abuse Paternal Grandmother   . Alcohol abuse Neg Hx   . Drug abuse Neg Hx   . Bipolar disorder Neg Hx   . Dementia Neg Hx   . Paranoid behavior Neg Hx   . Schizophrenia Neg Hx   . Sexual abuse Neg Hx     Social History:  Social History   Socioeconomic History  . Marital status: Single    Spouse name: Not on file  . Number of children: Not on file  . Years of education: Not on file  . Highest education level: Not on file  Occupational History  . Not on file  Tobacco Use  . Smoking status: Never Smoker  . Smokeless tobacco: Never Used  Substance and Sexual Activity  . Alcohol use: No  .  Drug use: No  . Sexual activity: Not on file  Other Topics Concern  . Not on file  Social History Narrative  . Not on file   Social Determinants of Health   Financial Resource Strain: Not on file  Food Insecurity: Not on file  Transportation Needs: Not on file  Physical Activity: Not on file  Stress: Not on file  Social Connections: Not on file    Allergies: No Known Allergies  Metabolic Disorder Labs: No results found for: HGBA1C, MPG No results found for: PROLACTIN No results found for: CHOL, TRIG, HDL, CHOLHDL, VLDL, LDLCALC No results found for: TSH  Therapeutic Level Labs: No results found for: LITHIUM No results found for: VALPROATE No components found for:  CBMZ  Current  Medications: Current Outpatient Medications  Medication Sig Dispense Refill  . lisdexamfetamine (VYVANSE) 50 MG capsule Take 1 capsule (50 mg total) by mouth daily. 30 capsule 0  . lisdexamfetamine (VYVANSE) 50 MG capsule Take 1 capsule (50 mg total) by mouth daily. 30 capsule 0  . lisdexamfetamine (VYVANSE) 50 MG capsule Take 1 capsule (50 mg total) by mouth daily. 30 capsule 0  . dexmethylphenidate (FOCALIN) 10 MG tablet Take one after school 30 tablet 0  . dexmethylphenidate (FOCALIN) 10 MG tablet TAKE ONE TABLET BY MOUTH AFTER SCHOOL 30 tablet 0  . dexmethylphenidate (FOCALIN) 10 MG tablet TAKE 1 TABLET BY MOUTH DAILY AFTER SCHOOL 30 tablet 0  . fexofenadine (ALLEGRA) 180 MG tablet Take 180 mg by mouth daily.    Marland Kitchen guanFACINE (INTUNIV) 2 MG TB24 ER tablet Take 1 tablet (2 mg total) by mouth daily. 90 tablet 2   No current facility-administered medications for this visit.     Musculoskeletal: Strength & Muscle Tone: within normal limits Gait & Station: normal Patient leans: N/A  Psychiatric Specialty Exam: Review of Systems  All other systems reviewed and are negative.   There were no vitals taken for this visit.There is no height or weight on file to calculate BMI.  General Appearance: Casual and Fairly Groomed  Eye Contact:  Good  Speech:  Clear and Coherent  Volume:  Normal  Mood:  Euthymic  Affect:  Appropriate and Congruent  Thought Process:  Goal Directed  Orientation:  Full (Time, Place, and Person)  Thought Content: WDL   Suicidal Thoughts:  No  Homicidal Thoughts:  No  Memory:  Immediate;   Good Recent;   Good Remote;   Good  Judgement:  Good  Insight:  Good  Psychomotor Activity:  Normal  Concentration:  Concentration: Good and Attention Span: Good  Recall:  Good  Fund of Knowledge: Good  Language: Good  Akathisia:  No  Handed:  Right  AIMS (if indicated): not done  Assets:  Communication Skills Desire for Improvement Physical Health Resilience Social  Support Talents/Skills Vocational/Educational  ADL's:  Intact  Cognition: WNL  Sleep:  Good   Screenings:   Assessment and Plan: This patient is a 25 year old male with a history of ADHD.  He continues to do well but would like a slightly dorsal lower dose of his ADHD medication.  He will change to Vyvanse 50 mg every morning for focus, Focalin 10 mg in the afternoon as needed for additional focus and Intuniv 2 mg daily also for ADHD.  He will return to see me in 3 months   Diannia Ruder, MD 11/09/2020, 1:46 PM

## 2020-11-16 ENCOUNTER — Telehealth (HOSPITAL_COMMUNITY): Payer: No Typology Code available for payment source | Admitting: Psychiatry

## 2021-01-10 ENCOUNTER — Other Ambulatory Visit (HOSPITAL_COMMUNITY): Payer: Self-pay

## 2021-02-23 ENCOUNTER — Other Ambulatory Visit (HOSPITAL_COMMUNITY): Payer: Self-pay

## 2021-04-05 ENCOUNTER — Other Ambulatory Visit (HOSPITAL_COMMUNITY): Payer: Self-pay

## 2021-04-05 ENCOUNTER — Other Ambulatory Visit (HOSPITAL_COMMUNITY): Payer: Self-pay | Admitting: Psychiatry

## 2021-04-05 DIAGNOSIS — F902 Attention-deficit hyperactivity disorder, combined type: Secondary | ICD-10-CM

## 2021-04-05 MED ORDER — LISDEXAMFETAMINE DIMESYLATE 50 MG PO CAPS
50.0000 mg | ORAL_CAPSULE | Freq: Every day | ORAL | 0 refills | Status: DC
  Filled 2021-04-05: qty 30, 30d supply, fill #0

## 2021-04-05 MED ORDER — DEXMETHYLPHENIDATE HCL 10 MG PO TABS
ORAL_TABLET | ORAL | 0 refills | Status: DC
  Filled 2021-04-05: qty 30, 30d supply, fill #0

## 2021-04-06 ENCOUNTER — Other Ambulatory Visit (HOSPITAL_COMMUNITY): Payer: Self-pay

## 2021-04-12 ENCOUNTER — Other Ambulatory Visit (HOSPITAL_COMMUNITY): Payer: Self-pay

## 2021-05-09 ENCOUNTER — Other Ambulatory Visit: Payer: Self-pay

## 2021-05-09 ENCOUNTER — Other Ambulatory Visit (HOSPITAL_COMMUNITY): Payer: Self-pay

## 2021-05-09 ENCOUNTER — Telehealth (INDEPENDENT_AMBULATORY_CARE_PROVIDER_SITE_OTHER): Payer: No Typology Code available for payment source | Admitting: Psychiatry

## 2021-05-09 ENCOUNTER — Encounter (HOSPITAL_COMMUNITY): Payer: Self-pay | Admitting: Psychiatry

## 2021-05-09 DIAGNOSIS — F902 Attention-deficit hyperactivity disorder, combined type: Secondary | ICD-10-CM | POA: Diagnosis not present

## 2021-05-09 MED ORDER — LISDEXAMFETAMINE DIMESYLATE 30 MG PO CAPS
30.0000 mg | ORAL_CAPSULE | ORAL | 0 refills | Status: DC
  Filled 2021-05-09 – 2021-08-08 (×2): qty 30, 30d supply, fill #0

## 2021-05-09 MED ORDER — DEXMETHYLPHENIDATE HCL 10 MG PO TABS
ORAL_TABLET | ORAL | 0 refills | Status: AC
  Filled 2021-05-09 – 2021-06-20 (×2): qty 30, 30d supply, fill #0

## 2021-05-09 MED ORDER — DEXMETHYLPHENIDATE HCL 10 MG PO TABS
ORAL_TABLET | ORAL | 0 refills | Status: AC
  Filled 2021-05-09 (×2): qty 30, 30d supply, fill #0

## 2021-05-09 MED ORDER — GUANFACINE HCL ER 2 MG PO TB24
2.0000 mg | ORAL_TABLET | Freq: Every day | ORAL | 2 refills | Status: AC
  Filled 2021-05-09 – 2021-09-18 (×2): qty 90, 90d supply, fill #0

## 2021-05-09 MED ORDER — LISDEXAMFETAMINE DIMESYLATE 30 MG PO CAPS
30.0000 mg | ORAL_CAPSULE | ORAL | 0 refills | Status: AC
  Filled 2021-05-09 (×2): qty 30, 30d supply, fill #0

## 2021-05-09 MED ORDER — LISDEXAMFETAMINE DIMESYLATE 30 MG PO CAPS
30.0000 mg | ORAL_CAPSULE | ORAL | 0 refills | Status: AC
  Filled 2021-05-09 – 2021-06-20 (×2): qty 30, 30d supply, fill #0

## 2021-05-09 MED ORDER — DEXMETHYLPHENIDATE HCL 10 MG PO TABS
ORAL_TABLET | ORAL | 0 refills | Status: AC
Start: 2021-05-09 — End: ?
  Filled 2021-05-09 (×2): qty 30, 30d supply, fill #0

## 2021-05-09 NOTE — Progress Notes (Signed)
Virtual Visit via Video Note  I connected with Harry Mcdonald on 05/09/21 at  1:00 PM EDT by a video enabled telemedicine application and verified that I am speaking with the correct person using two identifiers.  Location: Patient: home Provider: office   I discussed the limitations of evaluation and management by telemedicine and the availability of in person appointments. The patient expressed understanding and agreed to proceed.      I discussed the assessment and treatment plan with the patient. The patient was provided an opportunity to ask questions and all were answered. The patient agreed with the plan and demonstrated an understanding of the instructions.   The patient was advised to call back or seek an in-person evaluation if the symptoms worsen or if the condition fails to improve as anticipated.  I provided 20 minutes of non-face-to-face time during this encounter.   Diannia Ruder, MD  [facility] MD/PA/NP OP Progress Note  05/09/2021 1:28 PM Harry Mcdonald  MRN:  [id number]  Chief Complaint:  Chief Complaint   ADHD; Follow-up    HPI: This patient is a 25 year old white male who lives with his father in [place].  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  He graduated last spring from [employer].  The patient returns for follow-up after about 6 months.  He states that he was very lucky and that he was able to get a job in his field right out of college.  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  He has a goal now of trying to get off his ADHD medication.  He states he has been on it since childhood and wants to see if he can do without it.  Last time we cut the Vyvanse down and he thinks he is doing okay but would like to cut it down further and eventually get off of it.  I reminded him that there is no health reasons that he needs to get off it but he is pretty adamant about  wanting to try.  He still takes the Focalin occasionally as well and takes the Intuniv every time he takes the Vyvanse.  His mood has been good and he has been working out regularly in the gym and he really enjoys his work. Visit Diagnosis:    ICD-10-CM   1. ADHD (attention deficit hyperactivity disorder), combined type  F90.2 dexmethylphenidate (FOCALIN) 10 MG tablet    guanFACINE (INTUNIV) 2 MG TB24 ER tablet      Past Psychiatric History: none  Past Medical History:  Past Medical History:  Diagnosis Date   ADHD (attention deficit hyperactivity disorder)    Oppositional defiant disorder    History reviewed. No pertinent surgical history.  Family Psychiatric History: see below  Family History:  Family History  Problem Relation Age of Onset   Depression Mother    ADD / ADHD Mother    ADD / ADHD Father    Anxiety disorder Father    Depression Father    OCD Paternal Grandfather    Depression Paternal Grandmother    Seizures Paternal Grandmother        secondary to head trauma   Physical abuse Paternal Grandmother    Alcohol abuse Neg Hx    Drug abuse Neg Hx    Bipolar disorder Neg Hx    Dementia Neg Hx    Paranoid behavior Neg  Hx    Schizophrenia Neg Hx    Sexual abuse Neg Hx     Social History:  Social History   Socioeconomic History   Marital status: Single    Spouse name: Not on file   Number of children: Not on file   Years of education: Not on file   Highest education level: Not on file  Occupational History   Not on file  Tobacco Use   Smoking status: Never   Smokeless tobacco: Never  Substance and Sexual Activity   Alcohol use: No   Drug use: No   Sexual activity: Not on file  Other Topics Concern   Not on file  Social History Narrative   Not on file   Social Determinants of Health   Financial Resource Strain: Not on file  Food Insecurity: Not on file  Transportation Needs: Not on file  Physical Activity: Not on file  Stress: Not on file   Social Connections: Not on file    Allergies: No Known Allergies  Metabolic Disorder Labs: No results found for: HGBA1C, MPG No results found for: PROLACTIN No results found for: CHOL, TRIG, HDL, CHOLHDL, VLDL, LDLCALC No results found for: TSH  Therapeutic Level Labs: No results found for: LITHIUM No results found for: VALPROATE No components found for:  CBMZ  Current Medications: Current Outpatient Medications  Medication Sig Dispense Refill   lisdexamfetamine (VYVANSE) 30 MG capsule Take 1 capsule (30 mg total) by mouth every morning. 30 capsule 0   lisdexamfetamine (VYVANSE) 30 MG capsule Take 1 capsule (30 mg total) by mouth every morning. 30 capsule 0   lisdexamfetamine (VYVANSE) 30 MG capsule Take 1 capsule (30 mg total) by mouth every morning. 30 capsule 0   dexmethylphenidate (FOCALIN) 10 MG tablet Take one tablet by mouth after school (fill 01/08/21) 30 tablet 0   dexmethylphenidate (FOCALIN) 10 MG tablet TAKE ONE TABLET BY MOUTH AFTER SCHOOL (fill 12/08/20) 30 tablet 0   dexmethylphenidate (FOCALIN) 10 MG tablet TAKE 1 TABLET BY MOUTH DAILY AFTER SCHOOL 30 tablet 0   fexofenadine (ALLEGRA) 180 MG tablet Take 180 mg by mouth daily.     guanFACINE (INTUNIV) 2 MG TB24 ER tablet Take 1 tablet (2 mg total) by mouth daily. 90 tablet 2   No current facility-administered medications for this visit.     Musculoskeletal: Strength & Muscle Tone: within normal limits Gait & Station: normal Patient leans: N/A  Psychiatric Specialty Exam: Review of Systems  Psychiatric/Behavioral:  Positive for decreased concentration.   All other systems reviewed and are negative.  There were no vitals taken for this visit.There is no height or weight on file to calculate BMI.  General Appearance: Casual, Neat, and Well Groomed  Eye Contact:  Good  Speech:  Clear and Coherent  Volume:  Normal  Mood:  Euthymic  Affect:  Appropriate and Congruent  Thought Process:  Goal Directed   Orientation:  Full (Time, Place, and Person)  Thought Content: WDL   Suicidal Thoughts:  No  Homicidal Thoughts:  No  Memory:  Immediate;   Good Recent;   Good Remote;   Good  Judgement:  Good  Insight:  Good  Psychomotor Activity:  Normal  Concentration:  Concentration: Fair and Attention Span: Fair  Recall:  Good  Fund of Knowledge: Good  Language: Good  Akathisia:  No  Handed:  Right  AIMS (if indicated): not done  Assets:  Communication Skills Desire for Improvement Physical Health Resilience Social Support Talents/Skills  ADL's:  Intact  Cognition: WNL  Sleep:  Good   Screenings:   Assessment and Plan: This patient is a 25 year old male with a history of ADHD.  He wants to continue to cut down his dosing so that he can eventually get off ADHD medication.  We will cut Vyvanse down from 50 to 30 mg every morning for focus.  Continue Focalin 10 mg in the afternoon as needed for additional help with ADHD and continue Intuniv 2 mg daily also for ADHD.  He will return to see me in 3 months   Diannia Ruder, MD 05/09/2021, 1:28 PM

## 2021-06-20 ENCOUNTER — Other Ambulatory Visit (HOSPITAL_COMMUNITY): Payer: Self-pay

## 2021-08-08 ENCOUNTER — Other Ambulatory Visit (HOSPITAL_COMMUNITY): Payer: Self-pay

## 2021-09-18 ENCOUNTER — Other Ambulatory Visit (HOSPITAL_COMMUNITY): Payer: Self-pay

## 2021-09-18 ENCOUNTER — Other Ambulatory Visit (HOSPITAL_COMMUNITY): Payer: Self-pay | Admitting: Psychiatry

## 2021-09-18 MED ORDER — LISDEXAMFETAMINE DIMESYLATE 30 MG PO CAPS
30.0000 mg | ORAL_CAPSULE | ORAL | 0 refills | Status: DC
  Filled 2021-09-18: qty 30, 30d supply, fill #0

## 2021-10-23 ENCOUNTER — Other Ambulatory Visit (HOSPITAL_COMMUNITY): Payer: Self-pay

## 2021-10-23 ENCOUNTER — Other Ambulatory Visit (HOSPITAL_COMMUNITY): Payer: Self-pay | Admitting: Psychiatry

## 2021-10-23 MED ORDER — LISDEXAMFETAMINE DIMESYLATE 30 MG PO CAPS
30.0000 mg | ORAL_CAPSULE | ORAL | 0 refills | Status: AC
  Filled 2021-10-23: qty 30, 30d supply, fill #0

## 2021-10-23 NOTE — Telephone Encounter (Signed)
Call for appt
